# Patient Record
Sex: Female | Born: 1970
Health system: Southern US, Community
[De-identification: ages and names within clinical notes are randomized; demographics above are authoritative.]

## PROBLEM LIST (undated history)

## (undated) DIAGNOSIS — R7303 Prediabetes: Secondary | ICD-10-CM

## (undated) DIAGNOSIS — E538 Deficiency of other specified B group vitamins: Secondary | ICD-10-CM

## (undated) DIAGNOSIS — F411 Generalized anxiety disorder: Secondary | ICD-10-CM

## (undated) DIAGNOSIS — N200 Calculus of kidney: Secondary | ICD-10-CM

## (undated) DIAGNOSIS — E559 Vitamin D deficiency, unspecified: Secondary | ICD-10-CM

## (undated) HISTORY — PX: WISDOM TOOTH EXTRACTION: SHX21

## (undated) HISTORY — PX: ABDOMINAL SURGERY: SHX537

## (undated) HISTORY — PX: TUBAL LIGATION: SHX77

## (undated) HISTORY — DX: Calculus of kidney: N20.0

---

## 2006-09-12 ENCOUNTER — Emergency Department: Payer: Self-pay | Admitting: Emergency Medicine

## 2006-09-12 ENCOUNTER — Other Ambulatory Visit: Payer: Self-pay

## 2006-11-14 ENCOUNTER — Emergency Department: Payer: Self-pay | Admitting: Emergency Medicine

## 2006-11-14 ENCOUNTER — Other Ambulatory Visit: Payer: Self-pay

## 2007-07-19 ENCOUNTER — Inpatient Hospital Stay: Payer: Self-pay | Admitting: Obstetrics and Gynecology

## 2012-08-09 HISTORY — PX: TUBAL LIGATION: SHX77

## 2013-06-18 ENCOUNTER — Ambulatory Visit: Payer: Self-pay

## 2013-07-09 ENCOUNTER — Ambulatory Visit: Payer: Self-pay

## 2013-07-27 ENCOUNTER — Inpatient Hospital Stay: Payer: Self-pay | Admitting: Obstetrics & Gynecology

## 2013-07-27 LAB — CBC WITH DIFFERENTIAL/PLATELET
Basophil %: 0.3 %
Eosinophil #: 0.1 10*3/uL (ref 0.0–0.7)
Eosinophil %: 1 %
Lymphocyte %: 22.4 %
MCHC: 32.7 g/dL (ref 32.0–36.0)
MCV: 84 fL (ref 80–100)
Monocyte #: 0.8 x10 3/mm (ref 0.2–0.9)
Neutrophil #: 7.2 10*3/uL — ABNORMAL HIGH (ref 1.4–6.5)
Neutrophil %: 68.9 %
Platelet: 277 10*3/uL (ref 150–440)
RBC: 3.67 10*6/uL — ABNORMAL LOW (ref 3.80–5.20)
RDW: 14.5 % (ref 11.5–14.5)
WBC: 10.5 10*3/uL (ref 3.6–11.0)

## 2013-07-28 LAB — HEMATOCRIT: HCT: 29.1 % — ABNORMAL LOW (ref 35.0–47.0)

## 2014-06-24 ENCOUNTER — Emergency Department: Payer: Self-pay | Admitting: Emergency Medicine

## 2014-06-24 LAB — CBC
HCT: 39.8 % (ref 35.0–47.0)
HGB: 13 g/dL (ref 12.0–16.0)
MCH: 28.8 pg (ref 26.0–34.0)
MCHC: 32.6 g/dL (ref 32.0–36.0)
MCV: 88 fL (ref 80–100)
PLATELETS: 316 10*3/uL (ref 150–440)
RBC: 4.51 10*6/uL (ref 3.80–5.20)
RDW: 14.8 % — AB (ref 11.5–14.5)
WBC: 8.7 10*3/uL (ref 3.6–11.0)

## 2014-06-24 LAB — URINALYSIS, COMPLETE
Bilirubin,UR: NEGATIVE
GLUCOSE, UR: NEGATIVE mg/dL (ref 0–75)
KETONE: NEGATIVE
Leukocyte Esterase: NEGATIVE
Nitrite: NEGATIVE
Ph: 5 (ref 4.5–8.0)
Protein: 30
RBC,UR: 29 /HPF (ref 0–5)
Specific Gravity: 1.024 (ref 1.003–1.030)
Squamous Epithelial: 12

## 2014-06-24 LAB — COMPREHENSIVE METABOLIC PANEL
AST: 17 U/L (ref 15–37)
Albumin: 3.8 g/dL (ref 3.4–5.0)
Alkaline Phosphatase: 104 U/L
Anion Gap: 7 (ref 7–16)
BUN: 7 mg/dL (ref 7–18)
Bilirubin,Total: 0.5 mg/dL (ref 0.2–1.0)
CREATININE: 0.71 mg/dL (ref 0.60–1.30)
Calcium, Total: 8.4 mg/dL — ABNORMAL LOW (ref 8.5–10.1)
Chloride: 108 mmol/L — ABNORMAL HIGH (ref 98–107)
Co2: 26 mmol/L (ref 21–32)
EGFR (Non-African Amer.): >60
GLUCOSE: 116 mg/dL — AB (ref 65–99)
Osmolality: 280 (ref 275–301)
Potassium: 3.6 mmol/L (ref 3.5–5.1)
SGPT (ALT): 36 U/L
Sodium: 141 mmol/L (ref 136–145)
Total Protein: 6.9 g/dL (ref 6.4–8.2)

## 2014-07-01 ENCOUNTER — Ambulatory Visit: Payer: Self-pay | Admitting: Urology

## 2014-07-10 ENCOUNTER — Ambulatory Visit: Payer: Self-pay | Admitting: Urology

## 2014-07-26 ENCOUNTER — Ambulatory Visit: Payer: Self-pay | Admitting: Urology

## 2014-11-29 NOTE — Op Note (Signed)
PATIENT NAME:  Janet Harmon, Janet Harmon MR#:  [id number] DATE OF BIRTH:  [birth year]  DATE OF PROCEDURE:  07/27/2013  PREOPERATIVE DIAGNOSES:  Multiparous female, previous cesarean section, spontaneous rupture of membranes, unknown group B strep.   POSTOPERATIVE DIAGNOSES:  Multiparous female, previous cesarean section, spontaneous rupture of membranes, unknown group B strep. Female sterilized by bilateral salpingectomy.   PROCEDURE:  Lower uterine transverse cesarean section.  ESTIMATED BLOOD LOSS:   1000 mL.  SURGEON: Erik Obey, M.D.   ASSISTANT:  Marcella Dubs.     ANESTHESIA: Spinal and local.   FINDINGS: Late preterm liveborn female infant at 3:02 a.m. weighing 7 pounds 3 ounces, 3260 grams; Apgars 8 and 9. Normal uterus, tubes and ovaries.    INDICATION: Multiparous female desiring permanent sterilization.   PROCEDURE:  The patient was taken to the operating room and placed in supine position. After adequate spinal anesthesia was instilled, the patient was prepped and draped in the usual sterile fashion. Pfannenstiel skin incision was made approximately 3 fingerbreadths above the pubic symphysis and carried sharply down through her previous Phan incision. Then, the fascia was nicked in the midline, and the incision was extended in a superolateral manner. The rectus fascia was sharply and bluntly dissected off the rectus muscles. The midline in the muscle bellies was identified. The peritoneum was grasped and entered. A bladder blade and placed. A bladder flap was created. Uterine incision was made. The incision was extended with the surgeon's fingers. The infant's head was delivered. Due to angle of the belly, suction was placed on the infant's head, and the infant was delivered with assistance. The remainder of the body was delivered. The cord was clamped and cut. The infant was handed off to the awaiting pediatrician. Cord blood was obtained. Pitocin was started. The uterus was delivered. The  placenta was delivered. The uterus was wrapped in a moist laparotomy sponge, and the interior of the uterus was curetted with a moist lap sponge. Uterine incision was closed with a running locked chromic suture, then a running imbricating chromic suture; 3-0 chromic was used to tack the bladder back up to the uterine incision. The belly was cleared of clots and Kary Kos was used to grab first the right then the left tube. Kelleys were used to grasp the tissue of the peritoneum between the tube and the ovary. This was then cut with a scissors. A plain gut suture was used to tie off the tube. Plain gut suture on a needle was then used to tie off the [place], then clamp off the blood vessels. This was done first on the left, then the right. Good hemostasis was identified. Good tubal telescoping was seen at the stump at the uterus. The uterus was then placed gently back into the pelvis. Good hemostasis was identified, and gutters were cleared of clots. The muscle bellies were approximated with a running Vicryl suture. The ON-Q trocars were placed. The ON-Q catheters were threaded. These were centered around the muscle bellies. The fascial incision was closed with a running Vicryl suture. Plain gut suture was used to approximate the subcutaneous fat. Skin clips were placed. A 4 x 4 was placed over the ON-Q catheters to protect skin. A bandage was placed. Telfa was then placed over the skin incision. The uterus was massaged and found to be firm and free of clot. Clear urine was noted in the Foley bag, and the patient was taken to recovery after having tolerated the procedure well.     ____________________________ Janet Harmon  Clayburn Pert, MD cck:dmm D: 07/30/2013 08:42:00 ET T: 07/30/2013 11:12:09 ET JOB#: [id number]  cc: Delsa Sale, MD, <Dictator> Delsa Sale MD ELECTRONICALLY SIGNED 08/03/2013 22:28

## 2014-12-17 NOTE — H&P (Signed)
L&D Evaluation:  History Expanded:  HPI 44 yo G4P3 who presents at 75 W 5 day with SROM fluid clear and unknown GBS. SHe is RH pos and RUBI/VI/ and got tdap 06/05/13 she is for repeat csection and salpingectomy.   Gravida 4   Term 3   PreTerm 0   Abortion 0   Living 3   Blood Type (Maternal) A positive   Group B Strep Results Maternal (Result >5wks must be treated as unknown) unknown/result > 5 weeks ago   Maternal HIV Negative   Maternal Syphilis Ab Nonreactive   Maternal Varicella Immune   Rubella Results (Maternal) immune   Maternal T-Dap Immune   [facility] 12-Aug-2012   Presents with SROM   Patient's Medical History gestational diabetes and AMA,   Patient's Surgical History Previous C-Section   Medications Pre Natal Vitamins   Allergies NKDA   Social History none   Family History Non-Contributory   ROS:  ROS All systems were reviewed.  HEENT, CNS, GI, GU, Respiratory, CV, Renal and Musculoskeletal systems were found to be normal.   Exam:  Vital Signs stable   General no apparent distress   Mental Status clear   Chest clear   Heart normal sinus rhythm   Abdomen gravid, non-tender   Estimated Fetal Weight Small for gestational age   Fundal Height twerm   Back no CVAT   Edema no edema   Reflexes 1+   Pelvic no external lesions   Mebranes Ruptured   Description clear   FHT normal rate with no decels   Ucx absent   Skin dry   Lymph no lymphadenopathy   Impression:  Impression SROM   Plan:  Plan antibiotics for GBBS prophylaxis   Comments to OR for csection   Follow Up Appointment need to schedule. in 6 weeks   Electronic Signatures: Erik Obey (MD)  (Signed 19-Dec-14 02:39)  Authored: L&D Evaluation   Last Updated: 19-Dec-14 02:39 by Erik Obey (MD)

## 2015-11-06 DIAGNOSIS — E669 Obesity, unspecified: Secondary | ICD-10-CM | POA: Insufficient documentation

## 2016-05-01 ENCOUNTER — Emergency Department
Admission: EM | Admit: 2016-05-01 | Discharge: 2016-05-01 | Disposition: A | Discharge status: Home / Self Care | Payer: 59 | Attending: Emergency Medicine | Admitting: Emergency Medicine

## 2016-05-01 ENCOUNTER — Encounter: Payer: Self-pay | Admitting: Emergency Medicine

## 2016-05-01 ENCOUNTER — Emergency Department: Payer: 59

## 2016-05-01 DIAGNOSIS — M545 Low back pain: Secondary | ICD-10-CM | POA: Diagnosis present

## 2016-05-01 DIAGNOSIS — N200 Calculus of kidney: Secondary | ICD-10-CM | POA: Diagnosis not present

## 2016-05-01 LAB — POCT PREGNANCY, URINE: PREG TEST UR: NEGATIVE

## 2016-05-01 MED ORDER — DEXAMETHASONE SODIUM PHOSPHATE 10 MG/ML IJ SOLN
10.0000 mg | Freq: Once | INTRAMUSCULAR | Status: AC
  Administered 2016-05-01: 10 mg via INTRAMUSCULAR
  Filled 2016-05-01: qty 1

## 2016-05-01 MED ORDER — OXYCODONE-ACETAMINOPHEN 7.5-325 MG PO TABS
1.0000 | ORAL_TABLET | ORAL | 0 refills | Status: DC | PRN
Start: 2016-05-01 — End: 2016-12-22

## 2016-05-01 MED ORDER — METHOCARBAMOL 500 MG PO TABS
1000.0000 mg | ORAL_TABLET | Freq: Once | ORAL | Status: AC
  Administered 2016-05-01: 1000 mg via ORAL
  Filled 2016-05-01: qty 2

## 2016-05-01 MED ORDER — HYDROMORPHONE HCL 1 MG/ML IJ SOLN
1.0000 mg | Freq: Once | INTRAMUSCULAR | Status: AC
  Administered 2016-05-01: 1 mg via INTRAMUSCULAR
  Filled 2016-05-01: qty 1

## 2016-05-01 NOTE — ED Triage Notes (Signed)
Was bending over to pick up toe and suddenonset low back pain radiating to both legs.

## 2016-05-01 NOTE — ED Provider Notes (Signed)
[facility] Emergency Department Provider Note   ____________________________________________   First MD Initiated Contact with Patient 05/01/16 1233     (approximate)  I have reviewed the triage vital signs and the nursing notes.   HISTORY  Chief Complaint Back Pain    HPI Janet Harmon is a 45 y.o. female patient complaining of acute onset of back pain secondary to flexion incident. Patient stated returning to erect position she experience bilateral acute back pain. Patient state radicular patient denies any bladder or bowel dysfunction. component to the bilateral upper legs. Patient rated the pain as a 10 over 10. Patient described a pain as "discomfort/achy". No palliative measures prior to arrival. History reviewed. No pertinent past medical history.  There are no active problems to display for this patient.   Past Surgical History:  Procedure Laterality Date  . CESAREAN SECTION      Prior to Admission medications   Medication Sig Start Date End Date Taking? Authorizing Provider  oxyCODONE-acetaminophen (PERCOCET) 7.5-325 MG tablet Take 1 tablet by mouth every 4 (four) hours as needed for severe pain. 05/01/16   Sable Feil, PA-C    Allergies Review of patient's allergies indicates no known allergies.  No family history on file.  Social History Social History  Substance Use Topics  . Smoking status: Never Smoker  . Smokeless tobacco: Not on file  . Alcohol use Not on file    Review of Systems Constitutional: No fever/chills. Moderate distress Eyes: No visual changes. ENT: No sore throat. Cardiovascular: Denies chest pain. Respiratory: Denies shortness of breath. Gastrointestinal: No abdominal pain.  No nausea, no vomiting.  No diarrhea.  No constipation. Genitourinary: Negative for dysuria. Musculoskeletal: Positive for bilateral  back pain. Skin: Negative for rash. Neurological: Negative for headaches, focal weakness or  numbness.    ____________________________________________   PHYSICAL EXAM:  VITAL SIGNS: ED Triage Vitals  Enc Vitals Group     BP 05/01/16 1047 123/76     Pulse Rate 05/01/16 1047 89     Resp 05/01/16 1047 20     Temp 05/01/16 1047 98.2 F (36.8 C)     Temp Source 05/01/16 1047 Oral     SpO2 05/01/16 1047 97 %     Weight 05/01/16 1049 200 lb (90.7 kg)     Height 05/01/16 1049 5\' 5"  (1.651 m)     Head Circumference --      Peak Flow --      Pain Score 05/01/16 1049 10     Pain Loc --      Pain Edu? --      Excl. in [place]? --     Constitutional: Alert and oriented. Well appearing and in no acute distress. Eyes: Conjunctivae are normal. PERRL. EOMI. Head: Atraumatic. Nose: No congestion/rhinnorhea. Mouth/Throat: Mucous membranes are moist.  Oropharynx non-erythematous. Neck: No stridor.  No cervical spine tenderness to palpation. Hematological/Lymphatic/Immunilogical: No cervical lymphadenopathy. Cardiovascular: Normal rate, regular rhythm. Grossly normal heart sounds.  Good peripheral circulation. Respiratory: Normal respiratory effort.  No retractions. Lungs CTAB. Gastrointestinal: Soft and nontender. No distention. No abdominal bruits. No CVA tenderness. Musculoskeletal: Obvious deformity to the back. Patient decreased range of motion's in all fields. Patient had bilateral flank guarding.  Neurologic:  Normal speech and language. No gross focal neurologic deficits are appreciated. No gait instability. Skin:  Skin is warm, dry and intact. No rash noted. Psychiatric: Mood and affect are normal. Speech and behavior are normal.  ____________________________________________   LABS (all  labs ordered are listed, but only abnormal results are displayed)  Labs Reviewed  POC URINE PREG, ED  POCT PREGNANCY, URINE   ____________________________________________  EKG   ____________________________________________  RADIOLOGY  Findings were consistent with bilateral  nonobstructive renal stones.____________________________________________   PROCEDURES  Procedure(s) performed: None  Procedures  Critical Care performed: No  ____________________________________________   INITIAL IMPRESSION / ASSESSMENT AND PLAN / ED COURSE  Pertinent labs & imaging results that were available during my care of the patient were reviewed by me and considered in my medical decision making (see chart for details).  Bilateral kidney stone. Discussed Percocet x-ray findings with palpation. Patient given discharge care instructions. Patient advised to follow-up with urologist no improvement in 2-3 days. Return to ER if condition worsens. Patient get a prescription for Percocets.  Clinical Course     ____________________________________________   FINAL CLINICAL IMPRESSION(S) / ED DIAGNOSES  Final diagnoses:  Kidney stone      NEW MEDICATIONS STARTED DURING THIS VISIT:  New Prescriptions   OXYCODONE-ACETAMINOPHEN (PERCOCET) 7.5-325 MG TABLET    Take 1 tablet by mouth every 4 (four) hours as needed for severe pain.     Note:  This document was prepared using Dragon voice recognition software and may include unintentional dictation errors.    Sable Feil, PA-C 05/01/16 Haskins Malinda, MD 05/01/16 [phone]

## 2016-05-01 NOTE — ED Notes (Signed)
Pt in xr

## 2016-05-20 ENCOUNTER — Encounter: Payer: Self-pay | Admitting: *Deleted

## 2016-05-20 ENCOUNTER — Encounter
Admission: RE | Disposition: A | Discharge status: Home / Self Care | Payer: Self-pay | Source: Ambulatory Visit | Attending: Urology

## 2016-05-20 ENCOUNTER — Ambulatory Visit
Admission: RE | Admit: 2016-05-20 | Discharge: 2016-05-20 | Disposition: A | Discharge status: Home / Self Care | Payer: 59 | Source: Ambulatory Visit | Attending: Urology | Admitting: Urology

## 2016-05-20 DIAGNOSIS — N200 Calculus of kidney: Secondary | ICD-10-CM | POA: Insufficient documentation

## 2016-05-20 DIAGNOSIS — Z9851 Tubal ligation status: Secondary | ICD-10-CM | POA: Insufficient documentation

## 2016-05-20 DIAGNOSIS — Z6833 Body mass index (BMI) 33.0-33.9, adult: Secondary | ICD-10-CM | POA: Insufficient documentation

## 2016-05-20 DIAGNOSIS — E669 Obesity, unspecified: Secondary | ICD-10-CM | POA: Diagnosis not present

## 2016-05-20 HISTORY — PX: EXTRACORPOREAL SHOCK WAVE LITHOTRIPSY: SHX1557

## 2016-05-20 LAB — POCT PREGNANCY, URINE: PREG TEST UR: NEGATIVE

## 2016-05-20 SURGERY — LITHOTRIPSY, ESWL
Anesthesia: Moderate Sedation | Laterality: Left

## 2016-05-20 MED ORDER — DIPHENHYDRAMINE HCL 25 MG PO CAPS
ORAL_CAPSULE | ORAL | Status: AC
  Filled 2016-05-20: qty 1

## 2016-05-20 MED ORDER — PROMETHAZINE HCL 25 MG/ML IJ SOLN
25.0000 mg | Freq: Once | INTRAMUSCULAR | Status: AC
  Administered 2016-05-20: 25 mg via INTRAMUSCULAR

## 2016-05-20 MED ORDER — MORPHINE SULFATE (PF) 2 MG/ML IV SOLN
INTRAVENOUS | Status: AC
  Administered 2016-05-20: 2 mg via INTRAMUSCULAR
  Filled 2016-05-20: qty 1

## 2016-05-20 MED ORDER — DIPHENHYDRAMINE HCL 25 MG PO CAPS
25.0000 mg | ORAL_CAPSULE | ORAL | Status: AC
  Administered 2016-05-20: 25 mg via ORAL

## 2016-05-20 MED ORDER — NUCYNTA 50 MG PO TABS: 50.0000 mg | ORAL_TABLET | Freq: Four times a day (QID) | ORAL | 0 refills | Status: DC | PRN

## 2016-05-20 MED ORDER — FUROSEMIDE 10 MG/ML IJ SOLN: 10.0000 mg | Freq: Once | INTRAMUSCULAR | Status: DC

## 2016-05-20 MED ORDER — MIDAZOLAM HCL 2 MG/2ML IJ SOLN
INTRAMUSCULAR | Status: AC
  Filled 2016-05-20: qty 2

## 2016-05-20 MED ORDER — ONDANSETRON 8 MG PO TBDP: 8.0000 mg | ORAL_TABLET | Freq: Four times a day (QID) | ORAL | 3 refills | Status: DC | PRN

## 2016-05-20 MED ORDER — MIDAZOLAM HCL 2 MG/2ML IJ SOLN
1.0000 mg | Freq: Once | INTRAMUSCULAR | Status: AC
  Administered 2016-05-20: 1 mg via INTRAMUSCULAR

## 2016-05-20 MED ORDER — MORPHINE SULFATE (PF) 2 MG/ML IV SOLN
10.0000 mg | Freq: Once | INTRAVENOUS | Status: DC
  Filled 2016-05-20: qty 5

## 2016-05-20 MED ORDER — DEXTROSE-NACL 5-0.45 % IV SOLN
INTRAVENOUS | Status: DC
  Administered 2016-05-20: 07:00:00 via INTRAVENOUS

## 2016-05-20 MED ORDER — FUROSEMIDE 10 MG/ML IJ SOLN
INTRAMUSCULAR | Status: DC
Start: 2016-05-20 — End: 2016-05-20
  Filled 2016-05-20: qty 2

## 2016-05-20 MED ORDER — LEVOFLOXACIN 500 MG PO TABS
500.0000 mg | ORAL_TABLET | ORAL | Status: AC
  Administered 2016-05-20: 500 mg via ORAL

## 2016-05-20 MED ORDER — LEVOFLOXACIN 500 MG PO TABS
ORAL_TABLET | ORAL | Status: AC
  Filled 2016-05-20: qty 1

## 2016-05-20 MED ORDER — PROMETHAZINE HCL 25 MG/ML IJ SOLN
INTRAMUSCULAR | Status: AC
  Filled 2016-05-20: qty 1

## 2016-05-20 NOTE — OR Nursing (Signed)
Redness on left flank noted

## 2016-05-20 NOTE — Discharge Instructions (Addendum)
Kidney Stones °Kidney stones (urolithiasis) are deposits that form inside your kidneys. The intense pain is caused by the stone moving through the urinary tract. When the stone moves, the ureter goes into spasm around the stone. The stone is usually passed in the urine.  °CAUSES  °· A disorder that makes certain neck glands produce too much parathyroid hormone (primary hyperparathyroidism). °· A buildup of uric acid crystals, similar to gout in your joints. °· Narrowing (stricture) of the ureter. °· A kidney obstruction present at birth (congenital obstruction). °· Previous surgery on the kidney or ureters. °· Numerous kidney infections. °SYMPTOMS  °· Feeling sick to your stomach (nauseous). °· Throwing up (vomiting). °· Blood in the urine (hematuria). °· Pain that usually spreads (radiates) to the groin. °· Frequency or urgency of urination. °DIAGNOSIS  °· Taking a history and physical exam. °· Blood or urine tests. °· CT scan. °· Occasionally, an examination of the inside of the urinary bladder (cystoscopy) is performed. °TREATMENT  °· Observation. °· Increasing your fluid intake. °· Extracorporeal shock wave lithotripsy--This is a noninvasive procedure that uses shock waves to break up kidney stones. °· Surgery may be needed if you have severe pain or persistent obstruction. There are various surgical procedures. Most of the procedures are performed with the use of small instruments. Only small incisions are needed to accommodate these instruments, so recovery time is minimized. °The size, location, and chemical composition are all important variables that will determine the proper choice of action for you. Talk to your health care provider to better understand your situation so that you will minimize the risk of injury to yourself and your kidney.  °HOME CARE INSTRUCTIONS  °· Drink enough water and fluids to keep your urine clear or pale yellow. This will help you to pass the stone or stone fragments. °· Strain  all urine through the provided strainer. Keep all particulate matter and stones for your health care provider to see. The stone causing the pain may be as small as a grain of salt. It is very important to use the strainer each and every time you pass your urine. The collection of your stone will allow your health care provider to analyze it and verify that a stone has actually passed. The stone analysis will often identify what you can do to reduce the incidence of recurrences. °· Only take over-the-counter or prescription medicines for pain, discomfort, or fever as directed by your health care provider. °· Keep all follow-up visits as told by your health care provider. This is important. °· Get follow-up X-rays if required. The absence of pain does not always mean that the stone has passed. It may have only stopped moving. If the urine remains completely obstructed, it can cause loss of kidney function or even complete destruction of the kidney. It is your responsibility to make sure X-rays and follow-ups are completed. Ultrasounds of the kidney can show blockages and the status of the kidney. Ultrasounds are not associated with any radiation and can be performed easily in a matter of minutes. °· Make changes to your daily diet as told by your health care provider. You may be told to: °¨ Limit the amount of salt that you eat. °¨ Eat 5 or more servings of fruits and vegetables each day. °¨ Limit the amount of meat, poultry, fish, and eggs that you eat. °· Collect a 24-hour urine sample as told by your health care provider. You may need to collect another urine sample every 6-12   months. SEEK MEDICAL CARE IF:  You experience pain that is progressive and unresponsive to any pain medicine you have been prescribed. SEEK IMMEDIATE MEDICAL CARE IF:   Pain cannot be controlled with the prescribed medicine.  You have a fever or shaking chills.  The severity or intensity of pain increases over 18 hours and is not  relieved by pain medicine.  You develop a new onset of abdominal pain.  You feel faint or pass out.  You are unable to urinate.   This information is not intended to replace advice given to you by your health care provider. Make sure you discuss any questions you have with your health care provider.   Document Released: 07/26/2005 Document Revised: 04/16/2015 Document Reviewed: 12/27/2012 Elsevier Interactive Patient Education 2016 [place], Care After Refer to this sheet in the next few weeks. These instructions provide you with information on caring for yourself after your procedure. Your health care provider may also give you more specific instructions. Your treatment has been planned according to current medical practices, but problems sometimes occur. Call your health care provider if you have any problems or questions after your procedure. WHAT TO EXPECT AFTER THE PROCEDURE   Your urine may have a red tinge for a few days after treatment. Blood loss is usually minimal.  You may have soreness in the back or flank area. This usually goes away after a few days. The procedure can cause blotches or bruises on the back where the pressure wave enters the skin. These marks usually cause only minimal discomfort and should disappear in a short time.  Stone fragments should begin to pass within 24 hours of treatment. However, a delayed passage is not unusual.  You may have pain, discomfort, and feel sick to your stomach (nauseated) when the crushed fragments of stone are passed down the tube from the kidney to the bladder. Stone fragments can pass soon after the procedure and may last for up to 4-8 weeks.  A small number of patients may have severe pain when stone fragments are not able to pass, which leads to an obstruction.  If your stone is greater than 1 inch (2.5 cm) in diameter or if you have multiple stones that have a combined diameter greater than 1 inch (2.5 cm),  you may require more than one treatment.  If you had a stent placed prior to your procedure, you may experience some discomfort, especially during urination. You may experience the pain or discomfort in your flank or back, or you may experience a sharp pain or discomfort at the base of your penis or in your lower abdomen. The discomfort usually lasts only a few minutes after urinating. HOME CARE INSTRUCTIONS   Rest at home until you feel your energy improving.  Only take over-the-counter or prescription medicines for pain, discomfort, or fever as directed by your health care provider. Depending on the type of lithotripsy, you may need to take antibiotics and anti-inflammatory medicines for a few days.  Drink enough water and fluids to keep your urine clear or pale yellow. This helps "flush" your kidneys. It helps pass any remaining pieces of stone and prevents stones from coming back.  Most people can resume daily activities within 1-2 days after standard lithotripsy. It can take longer to recover from laser and percutaneous lithotripsy.  Strain all urine through the provided strainer. Keep all particulate matter and stones for your health care provider to see. The stone may be as small  as a grain of salt. It is very important to use the strainer each and every time you pass your urine. Any stones that are found can be sent to a medical lab for examination.  Visit your health care provider for a follow-up appointment in a few weeks. Your doctor may remove your stent if you have one. Your health care provider will also check to see whether stone particles still remain. SEEK MEDICAL CARE IF:   Your pain is not relieved by medicine.  You have a lasting nauseous feeling.  You feel there is too much blood in the urine.  You develop persistent problems with frequent or painful urination that does not at least partially improve after 2 days following the procedure.  You have a congested  cough.  You feel lightheaded.  You develop a rash or any other signs that might suggest an allergic problem.  You develop any reaction or side effects to your medicine(s). SEEK IMMEDIATE MEDICAL CARE IF:   You experience severe back or flank pain or both.  You see nothing but blood when you urinate.  You cannot pass any urine at all.  You have a fever or shaking chills.  You develop shortness of breath, difficulty breathing, or chest pain.  You develop vomiting that will not stop after 6-8 hours.  You have a fainting episode.   This information is not intended to replace advice given to you by your health care provider. Make sure you discuss any questions you have with your health care provider.   Document Released: 08/15/2007 Document Revised: 04/16/2015 Document Reviewed: 02/08/2013 Elsevier Interactive Patient Education 2016 [place].   Renal Colic Renal colic is pain that is caused by passing a kidney stone. The pain can be sharp and severe. It may be felt in the back, abdomen, side (flank), or groin. It can cause nausea. Renal colic can come and go. HOME CARE INSTRUCTIONS Watch your condition for any changes. The following actions may help to lessen any discomfort that you are feeling:  Take medicines only as directed by your health care provider.  Ask your health care provider if it is okay to take over-the-counter pain medicine.  Drink enough fluid to keep your urine clear or pale yellow. Drink 6-8 glasses of water each day.  Limit the amount of salt that you eat to less than 2 grams per day.  Reduce the amount of protein in your diet. Eat less meat, fish, nuts, and dairy.  Avoid foods such as spinach, rhubarb, nuts, or bran. These may make kidney stones more likely to form. SEEK MEDICAL CARE IF:  You have a fever or chills.  Your urine smells bad or looks cloudy.  You have pain or burning when you pass urine. SEEK IMMEDIATE MEDICAL CARE IF:  Your  flank pain or groin pain suddenly worsens.  You become confused or disoriented or you lose consciousness.   This information is not intended to replace advice given to you by your health care provider. Make sure you discuss any questions you have with your health care provider.   Document Released: 05/05/2005 Document Revised: 08/16/2014 Document Reviewed: 06/05/2014 Elsevier Interactive Patient Education [employer].  Lithotripsy Lithotripsy is a treatment that can sometimes help eliminate kidney stones and pain that they cause. A form of lithotripsy, also known as extracorporeal shock wave lithotripsy, is a nonsurgical procedure that helps your body rid itself of the kidney stone when it is too big to pass on its  own. Extracorporeal shock wave lithotripsy is a method of crushing a kidney stone with shock waves. These shock waves pass through your body and are focused on your stone. They cause the kidney stones to crumble while still in the urinary tract. It is then easier for the smaller pieces of stone to pass in the urine. Lithotripsy usually takes about an hour. It is done in a hospital, a lithotripsy center, or a mobile unit. It usually does not require an overnight stay. Your health care provider will instruct you on preparation for the procedure. Your health care provider will tell you what to expect afterward. LET [facility] CARE PROVIDER KNOW ABOUT:  Any allergies you have.  All medicines you are taking, including vitamins, herbs, eye drops, creams, and over-the-counter medicines.  Previous problems you or members of your family have had with the use of anesthetics.  Any blood disorders you have.  Previous surgeries you have had.  Medical conditions you have. RISKS AND COMPLICATIONS Generally, lithotripsy for kidney stones is a safe procedure. However, as with any procedure, complications can occur. Possible complications include:  Infection.  Bleeding of the  kidney.  Bruising of the kidney or skin.  Obstruction of the ureter.  Failure of the stone to fragment. BEFORE THE PROCEDURE  Do not eat or drink for 6-8 hours prior to the procedure. You may, however, take the medications with a sip of water that your physician instructs you to take  Do not take aspirin or aspirin-containing products for 7 days prior to your procedure  Do not take nonsteroidal anti-inflammatory products for 7 days prior to your procedure PROCEDURE A stent (flexible tube with holes) may be placed in your ureter. The ureter is the tube that transports the urine from the kidneys to the bladder. Your health care provider may place a stent before the procedure. This will help keep urine flowing from the kidney if the fragments of the stone block the ureter. You may have an IV tube placed in one of your veins to give you fluids and medicines. These medicines may help you relax or make you sleep. During the procedure, you will lie comfortably on a fluid-filled cushion or in a warm-water bath. After an X-ray or ultrasound exam to locate your stone, shock waves are aimed at the stone. If you are awake, you may feel a tapping sensation as the shock waves pass through your body. If large stone particles remain after treatment, a second procedure may be necessary at a later date. For comfort during the test:  Relax as much as possible.  Try to remain still as much as possible.  Try to follow instructions to speed up the test.  Let your health care provider know if you are uncomfortable, anxious, or in pain. AFTER THE PROCEDURE  After surgery, you will be taken to the recovery area. A nurse will watch and check your progress. Once you're awake, stable, and taking fluids well, you will be allowed to go home as long as there are no problems. You will also be allowed to pass your urine before discharge.You may be given antibiotics to help prevent infection. You may also be prescribed  pain medicine if needed. In a week or two, your health care provider may remove your stent, if you have one. You may first have an X-ray exam to check on how successful the fragmentation of your stone has been and how much of the stone has passed. Your health care provider will  check to see whether or not stone particles remain. SEEK IMMEDIATE MEDICAL CARE IF:  You develop a fever or shaking chills.  Your pain is not relieved by medicine.  You feel sick to your stomach (nauseated) and you vomit.  You develop heavy bleeding.  You have difficulty urinating.  You start to pass your stent from your penis.   This information is not intended to replace advice given to you by your health care provider. Make sure you discuss any questions you have with your health care provider.   Document Released: 07/23/2000 Document Revised: 08/16/2014 Document Reviewed: 02/08/2013 Elsevier Interactive Patient Education [employer].

## 2016-05-21 ENCOUNTER — Encounter: Payer: Self-pay | Admitting: Urology

## 2016-09-01 DIAGNOSIS — R350 Frequency of micturition: Secondary | ICD-10-CM | POA: Diagnosis not present

## 2016-09-01 DIAGNOSIS — J069 Acute upper respiratory infection, unspecified: Secondary | ICD-10-CM | POA: Diagnosis not present

## 2016-12-06 ENCOUNTER — Other Ambulatory Visit: Payer: Self-pay | Admitting: Family Medicine

## 2016-12-06 DIAGNOSIS — Z1231 Encounter for screening mammogram for malignant neoplasm of breast: Secondary | ICD-10-CM

## 2016-12-22 ENCOUNTER — Ambulatory Visit
Admission: RE | Admit: 2016-12-22 | Discharge: 2016-12-22 | Disposition: A | Discharge status: Home / Self Care | Payer: 59 | Source: Ambulatory Visit | Attending: Family Medicine | Admitting: Family Medicine

## 2016-12-22 ENCOUNTER — Encounter: Payer: Self-pay | Admitting: Advanced Practice Midwife

## 2016-12-22 ENCOUNTER — Ambulatory Visit (INDEPENDENT_AMBULATORY_CARE_PROVIDER_SITE_OTHER): Payer: 59 | Admitting: Advanced Practice Midwife

## 2016-12-22 VITALS — BP 124/76 | Ht 64.0 in | Wt 210.0 lb

## 2016-12-22 DIAGNOSIS — Z01419 Encounter for gynecological examination (general) (routine) without abnormal findings: Secondary | ICD-10-CM

## 2016-12-22 DIAGNOSIS — R102 Pelvic and perineal pain: Secondary | ICD-10-CM | POA: Diagnosis not present

## 2016-12-22 DIAGNOSIS — Z124 Encounter for screening for malignant neoplasm of cervix: Secondary | ICD-10-CM | POA: Diagnosis not present

## 2016-12-22 DIAGNOSIS — L918 Other hypertrophic disorders of the skin: Secondary | ICD-10-CM | POA: Diagnosis not present

## 2016-12-22 DIAGNOSIS — Z1231 Encounter for screening mammogram for malignant neoplasm of breast: Secondary | ICD-10-CM

## 2016-12-22 NOTE — Patient Instructions (Signed)
Skin Tag, Adult A skin tag (acrochordon) is a soft, extra growth of skin. Most skin tags are flesh-colored and rarely bigger than a pencil eraser. They commonly form near areas where there are folds in the skin, such as the armpit or groin. Skin tags are not dangerous, and they do not spread from person to person (are not contagious). You may have one skin tag or several. Skin tags do not require treatment. However, your health care provider may recommend removal of a skin tag if it:  Gets irritated from clothing.  Bleeds.  Is visible and unsightly. Your health care provider can remove skin tags with a simple surgical procedure or a procedure that involves freezing the skin tag. Follow these instructions at home:  Watch for any changes in your skin tag. A normal skin tag does not require any other special care at home.  Take over-the-counter and prescription medicines only as told by your health care provider.  Keep all follow-up visits as told by your health care provider. This is important. Contact a health care provider if:  You have a skin tag that:  Becomes painful.  Changes color.  Bleeds.  Swells.  You develop more skin tags. This information is not intended to replace advice given to you by your health care provider. Make sure you discuss any questions you have with your health care provider. Document Released: 08/10/2015 Document Revised: 03/21/2016 Document Reviewed: 08/10/2015 Elsevier Interactive Patient Education  2017 [employer].

## 2016-12-22 NOTE — Progress Notes (Signed)
Patient ID: Janet Harmon, female   DOB: 1970/09/13, 46 y.o.   MRN: [id number]     Gynecology Annual Exam  PCP: Ellene Route  Chief Complaint:  Chief Complaint  Patient presents with  . Annual Exam    History of Present Illness: Patient is a 46 y.o. Janet Harmon presents for annual exam. The patient has complaints today of pain in left pelvis. She is wondering if it is ovarian. She says the pain is daily and she has noticed it for several months. She also has two skin tags in the groin area that she states have been there for 5 years. Patient states that they have not changed in that time. She denies pain or tenderness generally but that they have bled when she shaved and came too close to the skin tag. They also are irritated by underwear. She is wondering if they can be removed today.    LMP: Patient's last menstrual period was 12/17/2016. Average Interval: regular, 28 days Duration of flow: 5 days Heavy Menses: no Clots: no Intermenstrual Bleeding: no Postcoital Bleeding: no Dysmenorrhea: no   The patient is sexually active. She currently uses bilateral tubal ligation for contraception. She denies dyspareunia.  The patient does not perform self breast exams.  There is no notable family history of breast or ovarian cancer in her family.  The patient wears seatbelts: yes.   The patient has regular exercise: she walks and is active with her children. She denies cardio exercise. She admits to improving her diet.  The patient denies current symptoms of depression.    Review of Systems: Review of Systems  Constitutional: Negative.   HENT: Negative.   Eyes: Negative.   Respiratory: Negative.   Cardiovascular: Negative.   Gastrointestinal: Negative.   Genitourinary: Negative.   Musculoskeletal: Negative.   Skin: Negative.   Neurological: Negative.   Endo/Heme/Allergies: Negative.   Psychiatric/Behavioral: Negative.     Past Medical History:  Past Medical History:    Diagnosis Date  . Kidney stone     Past Surgical History:  Past Surgical History:  Procedure Laterality Date  . CESAREAN SECTION    . EXTRACORPOREAL SHOCK WAVE LITHOTRIPSY Left 05/20/2016   Procedure: EXTRACORPOREAL SHOCK WAVE LITHOTRIPSY (ESWL);  Surgeon: Royston Cowper, MD;  Location: [facility] ORS;  Service: Urology;  Laterality: Left;  . TUBAL LIGATION    . WISDOM TOOTH EXTRACTION      Gynecologic History:  Patient's last menstrual period was 12/17/2016. Contraception: tubal ligation Last Pap: Results were: no abnormalities  Last mammogram: normal Obstetric History: [id number]  Family History:  History reviewed. No pertinent family history.  Social History:  Social History   Social History  . Marital status: Married    Spouse name: N/A  . Number of children: N/A  . Years of education: N/A   Occupational History  . Not on file.   Social History Main Topics  . Smoking status: Never Smoker  . Smokeless tobacco: Never Used  . Alcohol use No  . Drug use: No  . Sexual activity: Yes    Birth control/ protection: Surgical   Other Topics Concern  . Not on file   Social History Narrative  . No narrative on file    Allergies:  No Known Allergies  Medications: Prior to Admission medications   Medication Sig Start Date End Date Taking? Authorizing Provider  ondansetron (ZOFRAN ODT) 8 MG disintegrating tablet Take 1 tablet (8 mg total) by mouth every 6 (six) hours as needed for  nausea or vomiting. Patient not taking: Reported on 12/22/2016 05/20/16   Royston Cowper, MD    Physical Exam Vitals: Blood pressure 124/76, height 5\' 4"  (1.626 m), weight 210 lb (95.3 kg), last menstrual period 12/17/2016.  General: NAD HEENT: normocephalic, anicteric Thyroid: no enlargement, no palpable nodules Pulmonary: No increased work of breathing, CTAB Cardiovascular: RRR, distal pulses 2+ Breast: Breast symmetrical, no tenderness, no palpable nodules or masses, no skin or nipple  retraction present, no nipple discharge.  No axillary or supraclavicular lymphadenopathy. Abdomen: NABS, soft, non-tender, non-distended.  Umbilicus without lesions.  No hepatomegaly, splenomegaly or masses palpable. No evidence of hernia  Genitourinary:  External: Normal external female genitalia.  Normal urethral meatus, normal Bartholin's and Skene's glands.  Skin tags- one on right groin and one on left groin. Each one is .5cm x .5cm. No evidence of pathology.  Vagina: Normal vaginal mucosa, no evidence of prolapse.    Cervix: Grossly normal in appearance, no bleeding, no CMT  Uterus: Non-enlarged, mobile, normal contour.    Adnexa: ovaries non-enlarged, no adnexal masses, no tenderness palpated  Rectal: deferred  Lymphatic: no evidence of inguinal lymphadenopathy Extremities: no edema, erythema, or tenderness Neurologic: Grossly intact Psychiatric: mood appropriate, affect full  Procedure: 51ml 1% lidocaine injected just under each skin tag. Area cleaned with betadine. Excision using grasping forceps and surgical scissors. Both areas with minimal bleeding. Pressure with gauze and then Silver Nitrate applied. Procedure done with sterile technique.   Assessment: 46 y.o. Janet Harmon Well woman exam with PAP Plan: Problem List Items Addressed This Visit    None    Visit Diagnoses    Well woman exam with routine gynecological exam    -  Primary   Pelvic pain in female       Relevant Orders   US Transvaginal Non-OB   Multiple acquired skin tags       Cervical cancer screening          1) Mammogram - recommend yearly screening mammogram.  Mammogram patient to self schedule at [facility]   2) STI screening was offered and declined  3) ASCCP guidelines and rational discussed.  Patient opts for yearly screening interval  4) Continue healthy lifestyle diet and increase healthy lifestyle exercise  5) Gyn u/s in next week for left side pelvic pain  6) Routine healthcare  maintenance including cholesterol, diabetes screening discussed managed by PCP    Rod Can, CNM

## 2016-12-25 LAB — PAP IG (IMAGE GUIDED): PAP SMEAR COMMENT: 0

## 2017-01-05 ENCOUNTER — Ambulatory Visit: Payer: 59 | Admitting: Advanced Practice Midwife

## 2017-01-05 ENCOUNTER — Other Ambulatory Visit: Payer: 59

## 2017-01-12 ENCOUNTER — Ambulatory Visit: Payer: 59 | Admitting: Advanced Practice Midwife

## 2017-01-12 ENCOUNTER — Other Ambulatory Visit: Payer: 59

## 2017-01-24 ENCOUNTER — Emergency Department
Admission: EM | Admit: 2017-01-24 | Discharge: 2017-01-24 | Disposition: A | Discharge status: Home / Self Care | Payer: 59 | Attending: Emergency Medicine | Admitting: Emergency Medicine

## 2017-01-24 ENCOUNTER — Emergency Department: Payer: 59

## 2017-01-24 ENCOUNTER — Encounter: Payer: Self-pay | Admitting: Emergency Medicine

## 2017-01-24 DIAGNOSIS — N23 Unspecified renal colic: Secondary | ICD-10-CM | POA: Diagnosis not present

## 2017-01-24 DIAGNOSIS — R319 Hematuria, unspecified: Secondary | ICD-10-CM | POA: Diagnosis not present

## 2017-01-24 DIAGNOSIS — R109 Unspecified abdominal pain: Secondary | ICD-10-CM | POA: Diagnosis not present

## 2017-01-24 DIAGNOSIS — R1031 Right lower quadrant pain: Secondary | ICD-10-CM | POA: Diagnosis present

## 2017-01-24 DIAGNOSIS — N132 Hydronephrosis with renal and ureteral calculous obstruction: Secondary | ICD-10-CM | POA: Diagnosis not present

## 2017-01-24 LAB — URINALYSIS, COMPLETE (UACMP) WITH MICROSCOPIC
BILIRUBIN URINE: NEGATIVE
GLUCOSE, UA: NEGATIVE mg/dL
KETONES UR: NEGATIVE mg/dL
LEUKOCYTES UA: NEGATIVE
NITRITE: NEGATIVE
Protein, ur: NEGATIVE mg/dL
SPECIFIC GRAVITY, URINE: 1.021 (ref 1.005–1.030)
pH: 5 (ref 5.0–8.0)

## 2017-01-24 LAB — PREGNANCY, URINE: Preg Test, Ur: NEGATIVE

## 2017-01-24 MED ORDER — TAMSULOSIN HCL 0.4 MG PO CAPS
ORAL_CAPSULE | ORAL | Status: AC
  Administered 2017-01-24: 0.4 mg via ORAL
  Filled 2017-01-24: qty 1

## 2017-01-24 MED ORDER — KETOROLAC TROMETHAMINE 60 MG/2ML IM SOLN
15.0000 mg | Freq: Once | INTRAMUSCULAR | Status: AC
  Administered 2017-01-24: 15 mg via INTRAMUSCULAR
  Filled 2017-01-24: qty 2

## 2017-01-24 MED ORDER — OXYCODONE-ACETAMINOPHEN 5-325 MG PO TABS
2.0000 | ORAL_TABLET | Freq: Once | ORAL | Status: AC
  Administered 2017-01-24: 2 via ORAL
  Filled 2017-01-24: qty 2

## 2017-01-24 MED ORDER — TAMSULOSIN HCL 0.4 MG PO CAPS
0.4000 mg | ORAL_CAPSULE | Freq: Once | ORAL | Status: AC
  Administered 2017-01-24: 0.4 mg via ORAL

## 2017-01-24 MED ORDER — OXYCODONE-ACETAMINOPHEN 5-325 MG PO TABS: 1.0000 | ORAL_TABLET | Freq: Four times a day (QID) | ORAL | 0 refills | Status: DC | PRN

## 2017-01-24 MED ORDER — OXYCODONE-ACETAMINOPHEN 5-325 MG PO TABS
1.0000 | ORAL_TABLET | ORAL | Status: AC | PRN
  Administered 2017-01-24 (×2): 1 via ORAL
  Filled 2017-01-24 (×2): qty 1

## 2017-01-24 NOTE — ED Triage Notes (Signed)
Pt with right side flank pain started this am, pt with hx of kidney stones.

## 2017-01-24 NOTE — ED Notes (Signed)
Pt states right flank pain that began this AM, denies any blood in her urine, states feeling bloated and distended, awake and alert, tearful, resp even and unlabored

## 2017-01-24 NOTE — Discharge Instructions (Signed)
It was a pleasure to take care of you today, and thank you for coming to our emergency department.  If you have any questions or concerns before leaving please ask the nurse to grab me and I'm more than happy to go through your aftercare instructions again.  If you have any concerns once you are home that you are not improving or are in fact getting worse before you can make it to your follow-up appointment, please do not hesitate to call 911 and come back for further evaluation.  You should return to the emergency room immediately if you have new or severe symptoms such as chest pain, difficulty breathing, passing out, high fever, severe pain, or unremitting vomiting.   Available test results from today are listed below.   Carrie Mew, MD  Results for orders placed or performed during the hospital encounter of 01/24/17  Urinalysis, Complete w Microscopic  Result Value Ref Range   Color, Urine YELLOW (A) YELLOW   APPearance HAZY (A) CLEAR   Specific Gravity, Urine 1.021 1.005 - 1.030   pH 5.0 5.0 - 8.0   Glucose, UA NEGATIVE NEGATIVE mg/dL   Hgb urine dipstick LARGE (A) NEGATIVE   Bilirubin Urine NEGATIVE NEGATIVE   Ketones, ur NEGATIVE NEGATIVE mg/dL   Protein, ur NEGATIVE NEGATIVE mg/dL   Nitrite NEGATIVE NEGATIVE   Leukocytes, UA NEGATIVE NEGATIVE   RBC / HPF TOO NUMEROUS TO COUNT 0 - 5 RBC/hpf   WBC, UA 0-5 0 - 5 WBC/hpf   Bacteria, UA RARE (A) NONE SEEN   Squamous Epithelial / LPF 6-30 (A) NONE SEEN   Mucous PRESENT    Sperm, UA PRESENT   Pregnancy, urine  Result Value Ref Range   Preg Test, Ur NEGATIVE NEGATIVE   Ct Renal Stone Study  Result Date: 01/24/2017 CLINICAL DATA:  46 y/o F; right flank pain radiating to the right lower quadrant. EXAM: CT ABDOMEN AND PELVIS WITHOUT CONTRAST TECHNIQUE: Multidetector CT imaging of the abdomen and pelvis was performed following the standard protocol without IV contrast. COMPARISON:  None. FINDINGS: Lower chest: No acute abnormality.  Hepatobiliary: No focal liver abnormality is seen. No gallstones, gallbladder wall thickening, or biliary dilatation. Pancreas: Unremarkable. No pancreatic ductal dilatation or surrounding inflammatory changes. Spleen: Normal in size without focal abnormality. Adrenals/Urinary Tract: Normal adrenal glands. Multiple punctate nonobstructing stones in the kidneys bilaterally. Mild right hydronephrosis and moderate right-sided perinephric stranding and fluid secondary to a 3 mm obstructing stone likely within the right ureterovesicular junction projecting in the right bladder trigone (series 2, image 79). No left hydronephrosis.  Otherwise normal bladder. Stomach/Bowel: Stomach is within normal limits. Appendix appears normal. No evidence of bowel wall thickening, distention, or inflammatory changes. Vascular/Lymphatic: No significant vascular findings are present. No enlarged abdominal or pelvic lymph nodes. Reproductive: Uterus and bilateral adnexa are unremarkable. Other: No abdominal wall hernia or abnormality. No abdominopelvic ascites. Musculoskeletal: No acute or significant osseous findings. Articulation of right L5 transverse process with sacral ala. IMPRESSION: Mild right hydronephrosis and moderate right perinephric stranding secondary to a 3 mm stone likely obstructing the right intramural ureterovesicular junction. Electronically Signed   By: Kristine Garbe M.D.   On: 01/24/2017 20:39

## 2017-01-24 NOTE — ED Provider Notes (Signed)
[facility] Emergency Department Provider Note  ____________________________________________  Time seen: Approximately 9:22 PM  I have reviewed the triage vital signs and the nursing notes.   HISTORY  Chief Complaint Flank Pain    HPI Janet Harmon is a 46 y.o. female who complains of right flank pain radiating around to the right lower quadrant since this morning. No dysuria frequency urgency. No hematuria. No fevers or chills. No syncope. Has nausea but no vomiting. No diarrhea or constipation. Has a history of kidney stone but this feels worse. Pain is severe, intermittent waxing and waning. No aggravating or alleviating factors.     Past Medical History:  Diagnosis Date  . Kidney stone      Patient Active Problem List   Diagnosis Date Noted  . Obesity, Class II, BMI 35-39.9 11/06/2015     Past Surgical History:  Procedure Laterality Date  . CESAREAN SECTION    . EXTRACORPOREAL SHOCK WAVE LITHOTRIPSY Left 05/20/2016   Procedure: EXTRACORPOREAL SHOCK WAVE LITHOTRIPSY (ESWL);  Surgeon: Royston Cowper, MD;  Location: [facility] ORS;  Service: Urology;  Laterality: Left;  . TUBAL LIGATION    . WISDOM TOOTH EXTRACTION       Prior to Admission medications   Not on File     Allergies Patient has no known allergies.   History reviewed. No pertinent family history.  Social History Social History  Substance Use Topics  . Smoking status: Never Smoker  . Smokeless tobacco: Never Used  . Alcohol use No    Review of Systems  Constitutional:   No fever or chills.  ENT:   No sore throat. No rhinorrhea. Cardiovascular:   No chest pain or syncope. Respiratory:   No dyspnea or cough. Gastrointestinal:   Positive as above for right flank abdominal pain. No vomiting or diarrhea..  Musculoskeletal:   Negative for focal pain or swelling All other systems reviewed and are negative except as documented above in ROS and  HPI.  ____________________________________________   PHYSICAL EXAM:  VITAL SIGNS: ED Triage Vitals  Enc Vitals Group     BP 01/24/17 1612 136/82     Pulse Rate 01/24/17 1612 84     Resp 01/24/17 1612 14     Temp 01/24/17 1612 98.5 F (36.9 C)     Temp Source 01/24/17 1612 Oral     SpO2 01/24/17 1612 98 %     Weight 01/24/17 1612 200 lb (90.7 kg)     Height 01/24/17 1612 5\' 4"  (1.626 m)     Head Circumference --      Peak Flow --      Pain Score 01/24/17 1622 10     Pain Loc --      Pain Edu? --      Excl. in [place]? --     Vital signs reviewed, nursing assessments reviewed.   Constitutional:   Alert and oriented. Well appearing and in no distress. Eyes:   No scleral icterus.  EOMI. No nystagmus. No conjunctival pallor. PERRL. ENT   Head:   Normocephalic and atraumatic.   Nose:   No congestion/rhinnorhea.    Mouth/Throat:   MMM, no pharyngeal erythema. No peritonsillar mass.    Neck:   No meningismus. Full ROM Hematological/Lymphatic/Immunilogical:   No cervical lymphadenopathy. Cardiovascular:   RRR. Symmetric bilateral radial and DP pulses.  No murmurs.  Respiratory:   Normal respiratory effort without tachypnea/retractions. Breath sounds are clear and equal bilaterally. No wheezes/rales/rhonchi. Gastrointestinal:   Soft With  mild right lower quadrant tenderness. No tenderness at McBurney's point.. Non distended. There is no CVA tenderness.  No rebound, rigidity, or guarding. Genitourinary:   deferred Musculoskeletal:   Normal range of motion in all extremities. No joint effusions.  No lower extremity tenderness.  No edema. Neurologic:   Normal speech and language.  Motor grossly intact. No gross focal neurologic deficits are appreciated.  Skin:    Skin is warm, dry and intact. No rash noted.  No petechiae, purpura, or bullae.  ____________________________________________    LABS (pertinent positives/negatives) (all labs ordered are listed, but only  abnormal results are displayed) Labs Reviewed  URINALYSIS, COMPLETE (UACMP) WITH MICROSCOPIC - Abnormal; Notable for the following:       Result Value   Color, Urine YELLOW (*)    APPearance HAZY (*)    Hgb urine dipstick LARGE (*)    Bacteria, UA RARE (*)    Squamous Epithelial / LPF 6-30 (*)    All other components within normal limits  PREGNANCY, URINE   ____________________________________________   EKG    ____________________________________________    RADIOLOGY  Ct Renal Stone Study  Result Date: 01/24/2017 CLINICAL DATA:  46 y/o F; right flank pain radiating to the right lower quadrant. EXAM: CT ABDOMEN AND PELVIS WITHOUT CONTRAST TECHNIQUE: Multidetector CT imaging of the abdomen and pelvis was performed following the standard protocol without IV contrast. COMPARISON:  None. FINDINGS: Lower chest: No acute abnormality. Hepatobiliary: No focal liver abnormality is seen. No gallstones, gallbladder wall thickening, or biliary dilatation. Pancreas: Unremarkable. No pancreatic ductal dilatation or surrounding inflammatory changes. Spleen: Normal in size without focal abnormality. Adrenals/Urinary Tract: Normal adrenal glands. Multiple punctate nonobstructing stones in the kidneys bilaterally. Mild right hydronephrosis and moderate right-sided perinephric stranding and fluid secondary to a 3 mm obstructing stone likely within the right ureterovesicular junction projecting in the right bladder trigone (series 2, image 79). No left hydronephrosis.  Otherwise normal bladder. Stomach/Bowel: Stomach is within normal limits. Appendix appears normal. No evidence of bowel wall thickening, distention, or inflammatory changes. Vascular/Lymphatic: No significant vascular findings are present. No enlarged abdominal or pelvic lymph nodes. Reproductive: Uterus and bilateral adnexa are unremarkable. Other: No abdominal wall hernia or abnormality. No abdominopelvic ascites. Musculoskeletal: No acute or  significant osseous findings. Articulation of right L5 transverse process with sacral ala. IMPRESSION: Mild right hydronephrosis and moderate right perinephric stranding secondary to a 3 mm stone likely obstructing the right intramural ureterovesicular junction. Electronically Signed   By: Kristine Garbe M.D.   On: 01/24/2017 20:39    ____________________________________________   PROCEDURES Procedures  ____________________________________________   INITIAL IMPRESSION / ASSESSMENT AND PLAN / ED COURSE  Pertinent labs & imaging results that were available during my care of the patient were reviewed by me and considered in my medical decision making (see chart for details).  Patient well appearing no acute distress, unremarkable vital signs per likely kidney stone but with right-sided pain and some right lower quadrant tenderness, CT obtained. CT does confirm a 3 mm right UVJ stone. We'll give a dose of Flomax as well as Percocet here in the ED. Patient received IM Toradol as well. Will take ibuprofen at home as well. She a heart has an appointment with her urologist for tomorrow. We'll give her a urine strainer in case she passes the stone in the meantime.Considering the patient's symptoms, medical history, and physical examination today, I have low suspicion for cholecystitis or biliary pathology, pancreatitis, perforation or bowel obstruction, hernia, intra-abdominal  abscess, AAA or dissection, volvulus or intussusception, mesenteric ischemia, or appendicitis. Results not consistent with UTI or pyelonephritis       ____________________________________________   FINAL CLINICAL IMPRESSION(S) / ED DIAGNOSES  Final diagnoses:  Hematuria, unspecified type  Ureteral colic      New Prescriptions   No medications on file     Portions of this note were generated with dragon dictation software. Dictation errors may occur despite best attempts at proofreading.    Carrie Mew, MD 01/24/17 2124

## 2017-05-30 ENCOUNTER — Encounter: Payer: Self-pay | Admitting: Emergency Medicine

## 2017-05-30 ENCOUNTER — Emergency Department
Admission: EM | Admit: 2017-05-30 | Discharge: 2017-05-30 | Disposition: A | Discharge status: Home / Self Care | Payer: 59 | Attending: Emergency Medicine | Admitting: Emergency Medicine

## 2017-05-30 ENCOUNTER — Emergency Department: Payer: 59

## 2017-05-30 DIAGNOSIS — R079 Chest pain, unspecified: Secondary | ICD-10-CM | POA: Diagnosis not present

## 2017-05-30 DIAGNOSIS — R0789 Other chest pain: Secondary | ICD-10-CM | POA: Diagnosis not present

## 2017-05-30 LAB — BASIC METABOLIC PANEL
Anion gap: 9 (ref 5–15)
BUN: 8 mg/dL (ref 6–20)
CHLORIDE: 106 mmol/L (ref 101–111)
CO2: 26 mmol/L (ref 22–32)
CREATININE: 0.73 mg/dL (ref 0.44–1.00)
Calcium: 9.2 mg/dL (ref 8.9–10.3)
GFR calc non Af Amer: >60 mL/min (ref >60)
Glucose, Bld: 109 mg/dL — ABNORMAL HIGH (ref 65–99)
POTASSIUM: 3.6 mmol/L (ref 3.5–5.1)
SODIUM: 141 mmol/L (ref 135–145)

## 2017-05-30 LAB — CBC
HEMATOCRIT: 40 % (ref 35.0–47.0)
HEMOGLOBIN: 13.4 g/dL (ref 12.0–16.0)
MCH: 29.2 pg (ref 26.0–34.0)
MCHC: 33.4 g/dL (ref 32.0–36.0)
MCV: 87.4 fL (ref 80.0–100.0)
Platelets: 295 10*3/uL (ref 150–440)
RBC: 4.57 MIL/uL (ref 3.80–5.20)
RDW: 13.9 % (ref 11.5–14.5)
WBC: 8.8 10*3/uL (ref 3.6–11.0)

## 2017-05-30 LAB — TROPONIN I: Troponin I: <0.03 ng/mL (ref <0.03)

## 2017-05-30 MED ORDER — LORAZEPAM 0.5 MG PO TABS: 0.5000 mg | ORAL_TABLET | Freq: Every evening | ORAL | 0 refills | Status: AC | PRN

## 2017-05-30 NOTE — ED Provider Notes (Signed)
[facility] Emergency Department Provider Note  ____________________________________________   First MD Initiated Contact with Patient 05/30/17 1950     (approximate)  I have reviewed the triage vital signs and the nursing notes.   HISTORY  Chief Complaint Chest Pain    HPI Janet Harmon is a 46 y.o. female who presents to the emergency department with 3 days of atypical chest pain. The pain is pressure-like upper chest pain radiating towards her left arm. It is nonexertional. Not associated with nausea. Not associated with diaphoresis. The pain itself lasted seconds at a time. When she feels the pain she becomes quite anxious and hyperventilates. She feels some periorbital tingling as well as twitching in her right eye. She does not use birth control. She's had no recent travel or immobilization. She has no leg swelling. The pain is not ripping or tearing and does not go straight to her back.   Past Medical History:  Diagnosis Date  . Kidney stone     Patient Active Problem List   Diagnosis Date Noted  . Obesity, Class II, BMI 35-39.9 11/06/2015    Past Surgical History:  Procedure Laterality Date  . CESAREAN SECTION    . EXTRACORPOREAL SHOCK WAVE LITHOTRIPSY Left 05/20/2016   Procedure: EXTRACORPOREAL SHOCK WAVE LITHOTRIPSY (ESWL);  Surgeon: Royston Cowper, MD;  Location: [facility] ORS;  Service: Urology;  Laterality: Left;  . TUBAL LIGATION    . WISDOM TOOTH EXTRACTION      Prior to Admission medications   Medication Sig Start Date End Date Taking? Authorizing Provider  LORazepam (ATIVAN) 0.5 MG tablet Take 1 tablet (0.5 mg total) by mouth at bedtime as needed for anxiety or sleep. 05/30/17 06/04/17  Darel Hong, MD  oxyCODONE-acetaminophen (ROXICET) 5-325 MG tablet Take 1 tablet by mouth every 6 (six) hours as needed for severe pain. 01/24/17   Carrie Mew, MD    Allergies Patient has no known allergies.  No family history on  file.  Social History Social History  Substance Use Topics  . Smoking status: Never Smoker  . Smokeless tobacco: Never Used  . Alcohol use No    Review of Systems Constitutional: No fever/chills Eyes: No visual changes. ENT: No sore throat. Cardiovascular: positive for chest pain. Respiratory: Denies shortness of breath. Gastrointestinal: No abdominal pain.  No nausea, no vomiting.  No diarrhea.  No constipation. Genitourinary: Negative for dysuria. Musculoskeletal: Negative for back pain. Skin: Negative for rash. Neurological: Negative for headaches   ____________________________________________   PHYSICAL EXAM:  VITAL SIGNS: ED Triage Vitals  Enc Vitals Group     BP 05/30/17 1919 136/81     Pulse Rate 05/30/17 1919 88     Resp 05/30/17 1919 20     Temp 05/30/17 1919 97.9 F (36.6 C)     Temp Source 05/30/17 1919 Oral     SpO2 05/30/17 1919 98 %     Weight 05/30/17 1920 200 lb (90.7 kg)     Height 05/30/17 1920 5\' 4"  (1.626 m)     Head Circumference --      Peak Flow --      Pain Score --      Pain Loc --      Pain Edu? --      Excl. in [place]? --     Constitutional: alert and oriented 4 well appearing nontoxic no diaphoresis speaks full clear sentences Eyes: PERRL EOMI. Head: Atraumatic. Nose: No congestion/rhinnorhea. Mouth/Throat: No trismus Neck: No stridor.  Cardiovascular: Normal rate, regular rhythm. Grossly normal heart sounds.  Good peripheral circulation. Respiratory: Normal respiratory effort.  No retractions. Lungs CTAB and moving good air Gastrointestinal: . Soft Nontender Musculoskeletal: No lower extremity edema  legs are equal in size Neurologic:  Normal speech and language. No gross focal neurologic deficits are appreciated. Skin:  Skin is warm, dry and intact. No rash noted. Psychiatric: Mood and affect are normal. Speech and behavior are normal.    ____________________________________________   DIFFERENTIAL includes but not limited  to  G coronary syndrome, pulmonary embolus, pneumothorax, pneumonia, aortic dissection ____________________________________________   LABS (all labs ordered are listed, but only abnormal results are displayed)  Labs Reviewed  BASIC METABOLIC PANEL - Abnormal; Notable for the following:       Result Value   Glucose, Bld 109 (*)    All other components within normal limits  CBC  TROPONIN I    or were reviewed and interpreted by me shows no acute ischemia __________________________________________  EKG  ED ECG REPORT I, Darel Hong, the attending physician, personally viewed and interpreted this ECG.  Date: 05/30/2017 EKG Time:  Rate: 86 Rhythm: normal sinus rhythm QRS Axis: normal Intervals: normal ST/T Wave abnormalities: normal Narrative Interpretation: no evidence of acute ischemia  ____________________________________________  RADIOLOGY  chest x-ray reviewed by me shows no acute disease ____________________________________________   PROCEDURES  Procedure(s) performed: no  Procedures  Critical Care performed: no  Observation: no ____________________________________________   INITIAL IMPRESSION / ASSESSMENT AND PLAN / ED COURSE  Pertinent labs & imaging results that were available during my care of the patient were reviewed by me and considered in my medical decision making (see chart for details).       The patient has a HEART score of 1 with quite atypical chest pain.  She is PERC negative.  she feels significant relief when discussing her results. Regarding her. Oral tingling in her arm tingling I think this is likely secondary to hyperventilation. The patient has primary care follow-up in 2 days and strict return precautions have been discussed. She verbalizes understanding and agreement with the plan.____________________________________________   FINAL CLINICAL IMPRESSION(S) / ED DIAGNOSES  Final diagnoses:  Atypical chest pain       NEW MEDICATIONS STARTED DURING THIS VISIT:  New Prescriptions   LORAZEPAM (ATIVAN) 0.5 MG TABLET    Take 1 tablet (0.5 mg total) by mouth at bedtime as needed for anxiety or sleep.     Note:  This document was prepared using Dragon voice recognition software and may include unintentional dictation errors.     Darel Hong, MD 05/30/17 2057

## 2017-05-30 NOTE — ED Triage Notes (Signed)
Pt reports chest pain for 3 days radiating to left arm, reports left eye twitching, pt denies any other symptom denies any injury pt talks in complete sentences no respiratory distress noted

## 2017-05-30 NOTE — Discharge Instructions (Signed)
Please keep your appointment to see your primary care physician in 2 days as scheduled and return to the emergency department for any concerns.  It was a pleasure to take care of you today, and thank you for coming to our emergency department.  If you have any questions or concerns before leaving please ask the nurse to grab me and I'm more than happy to go through your aftercare instructions again.  If you were prescribed any opioid pain medication today such as Norco, Vicodin, Percocet, morphine, hydrocodone, or oxycodone please make sure you do not drive when you are taking this medication as it can alter your ability to drive safely.  If you have any concerns once you are home that you are not improving or are in fact getting worse before you can make it to your follow-up appointment, please do not hesitate to call 911 and come back for further evaluation.  Darel Hong, MD  Results for orders placed or performed during the hospital encounter of 38/25/05  Basic metabolic panel  Result Value Ref Range   Sodium 141 135 - 145 mmol/L   Potassium 3.6 3.5 - 5.1 mmol/L   Chloride 106 101 - 111 mmol/L   CO2 26 22 - 32 mmol/L   Glucose, Bld 109 (H) 65 - 99 mg/dL   BUN 8 6 - 20 mg/dL   Creatinine, Ser 0.73 0.44 - 1.00 mg/dL   Calcium 9.2 8.9 - 10.3 mg/dL   GFR calc non Af Amer >60 >60 mL/min   GFR calc Af Amer >60 >60 mL/min   Anion gap 9 5 - 15  CBC  Result Value Ref Range   WBC 8.8 3.6 - 11.0 K/uL   RBC 4.57 3.80 - 5.20 MIL/uL   Hemoglobin 13.4 12.0 - 16.0 g/dL   HCT 40.0 35.0 - 47.0 %   MCV 87.4 80.0 - 100.0 fL   MCH 29.2 26.0 - 34.0 pg   MCHC 33.4 32.0 - 36.0 g/dL   RDW 13.9 11.5 - 14.5 %   Platelets 295 150 - 440 K/uL  Troponin I  Result Value Ref Range   Troponin I <0.03 <0.03 ng/mL   Dg Chest 2 View  Result Date: 05/30/2017 CLINICAL DATA:  Chest pain radiates to left arm. EXAM: CHEST  2 VIEW COMPARISON:  11/14/2006 FINDINGS: The lungs are clear without focal pneumonia,  edema, pneumothorax or pleural effusion. The cardiopericardial silhouette is within normal limits for size. The visualized bony structures of the thorax are intact. IMPRESSION: No active cardiopulmonary disease. Electronically Signed   By: Misty Stanley M.D.   On: 05/30/2017 19:44

## 2017-06-01 DIAGNOSIS — Z0000 Encounter for general adult medical examination without abnormal findings: Secondary | ICD-10-CM | POA: Diagnosis not present

## 2017-06-01 DIAGNOSIS — E782 Mixed hyperlipidemia: Secondary | ICD-10-CM | POA: Diagnosis not present

## 2017-06-02 DIAGNOSIS — Z131 Encounter for screening for diabetes mellitus: Secondary | ICD-10-CM | POA: Diagnosis not present

## 2017-06-02 DIAGNOSIS — E559 Vitamin D deficiency, unspecified: Secondary | ICD-10-CM | POA: Diagnosis not present

## 2017-06-02 DIAGNOSIS — E782 Mixed hyperlipidemia: Secondary | ICD-10-CM | POA: Diagnosis not present

## 2017-06-02 DIAGNOSIS — E538 Deficiency of other specified B group vitamins: Secondary | ICD-10-CM | POA: Diagnosis not present

## 2017-06-17 ENCOUNTER — Ambulatory Visit: Payer: 59 | Admitting: Dietician

## 2017-06-21 ENCOUNTER — Encounter: Payer: 59 | Attending: Student | Admitting: Dietician

## 2017-06-21 ENCOUNTER — Encounter: Payer: Self-pay | Admitting: Dietician

## 2017-06-21 VITALS — Ht 65.0 in | Wt 221.3 lb

## 2017-06-21 DIAGNOSIS — Z6836 Body mass index (BMI) 36.0-36.9, adult: Secondary | ICD-10-CM

## 2017-06-21 DIAGNOSIS — R739 Hyperglycemia, unspecified: Secondary | ICD-10-CM

## 2017-06-21 DIAGNOSIS — Z6838 Body mass index (BMI) 38.0-38.9, adult: Secondary | ICD-10-CM | POA: Insufficient documentation

## 2017-06-21 DIAGNOSIS — E6609 Other obesity due to excess calories: Secondary | ICD-10-CM | POA: Diagnosis not present

## 2017-06-21 DIAGNOSIS — Z713 Dietary counseling and surveillance: Secondary | ICD-10-CM | POA: Diagnosis not present

## 2017-06-21 NOTE — Patient Instructions (Signed)
   Plan for a healthy snack at work, such as Light Greek yogurt, 1/4 cup nuts, lowfat cheese, fruit, whole grain cereal, etc.   1 diet coke a day is fine, as is coffee with 1 creamer.  Keep portions of starchy foods to 1 cup (fist size) or less with each meal.  Plan for meals to include a protein food, small portion of starch or starchy vegetable, and a "free" vegetable and/or fruit.   Add some exercises during the day to boost metabolism and increase strength.

## 2017-06-21 NOTE — Progress Notes (Signed)
Medical Nutrition Therapy: Visit start time: 1140  end time: 1250  Assessment:  Diagnosis: obesity Past medical history: hyperglycemia Psychosocial issues/ stress concerns: patient reports high stress level, feels she is dealing well with stress Preferred learning method:  . Auditory . Visual . Hands-on  Current weight: 221.3lbs  Height: 5'5" Medications, supplements: reconciled list in medical record  Progress and evaluation: Patient reports she had gestational diabetes 4 years ago; no BG issues since then until recent increase. She wants to lose weight to reduce risk for Type 2 diabetes, and to improve overall energy and health. She has already been working to decrease her intake of sugar, and limiting some food portions.   Physical activity: walking on the job  Dietary Intake:  Usual eating pattern includes 2 meals and 1 snacks per day. Dining out frequency: 0-1 meals per week, 1-2 times per month.  Breakfast: usually none. Works nights and eats when arriving home late at night Snack: none Lunch: noon-- at home loves pasta, salad with oil and vinegar dressing Snack: french fries (free at work) -- none in past 2 days Supper: usually late after work, 11pm. 11/12-- spaghetti, meatballs, salad, 3 breadsticks (husband cooked) Snack: none Beverages: water, 1 diet coke daily, coffee with 1 creamer no sugar  Nutrition Care Education: Topics covered: weight management, diabetes prevention Basic nutrition: basic food groups, appropriate nutrient balance, appropriate meal and snack schedule, general nutrition guidelines    Weight control: importance of low fat and low sugar food choices, portion control, plate method for planning balanced meals and examples of healthy balanced meals and snacks. Diabetes prevention: appropriate meal and snack schedule, appropriate carb intake and balance, healthy carb choices, role of exercise in BG control and weight control. Discussed importance of portion  control, moderation, and balanced nutrition.   Nutritional Diagnosis:  [place]-3.3 Overweight/obesity As related to excess calories.  As evidenced by BMI 36.8, patient report.  Intervention: Instruction as noted above.    Set goals with direction from patient.   Encouraged manageable and sustainable changes.    Answered questions regarding specific food choices.  Education Materials given:  . Plate Planner with food lists . Sample menus . Snacking handout  Food Guide for [employer] . Goals/ instructions  Learner/ who was taught:  . Patient   Level of understanding: Marland Kitchen Verbalizes/ demonstrates competency  Demonstrated degree of understanding via:   Teach back Learning barriers: . None  Willingness to learn/ readiness for change: . Eager, change in progress  Monitoring and Evaluation:  Dietary intake, exercise, BG control, and body weight      follow up: 08/16/17

## 2017-07-20 DIAGNOSIS — R1084 Generalized abdominal pain: Secondary | ICD-10-CM | POA: Diagnosis not present

## 2017-07-20 DIAGNOSIS — N200 Calculus of kidney: Secondary | ICD-10-CM | POA: Diagnosis not present

## 2017-07-20 DIAGNOSIS — M5489 Other dorsalgia: Secondary | ICD-10-CM | POA: Diagnosis not present

## 2017-08-07 ENCOUNTER — Emergency Department: Payer: 59

## 2017-08-07 ENCOUNTER — Emergency Department
Admission: EM | Admit: 2017-08-07 | Discharge: 2017-08-07 | Disposition: A | Discharge status: Home / Self Care | Payer: 59 | Attending: Emergency Medicine | Admitting: Emergency Medicine

## 2017-08-07 ENCOUNTER — Encounter: Payer: Self-pay | Admitting: Emergency Medicine

## 2017-08-07 ENCOUNTER — Other Ambulatory Visit: Payer: Self-pay

## 2017-08-07 DIAGNOSIS — Z79899 Other long term (current) drug therapy: Secondary | ICD-10-CM | POA: Insufficient documentation

## 2017-08-07 DIAGNOSIS — R102 Pelvic and perineal pain: Secondary | ICD-10-CM | POA: Diagnosis not present

## 2017-08-07 DIAGNOSIS — R1030 Lower abdominal pain, unspecified: Secondary | ICD-10-CM | POA: Diagnosis not present

## 2017-08-07 DIAGNOSIS — R109 Unspecified abdominal pain: Secondary | ICD-10-CM | POA: Diagnosis not present

## 2017-08-07 LAB — COMPREHENSIVE METABOLIC PANEL
ALBUMIN: 4.2 g/dL (ref 3.5–5.0)
ALK PHOS: 87 U/L (ref 38–126)
ALT: 28 U/L (ref 14–54)
ANION GAP: 8 (ref 5–15)
AST: 23 U/L (ref 15–41)
BILIRUBIN TOTAL: 1.1 mg/dL (ref 0.3–1.2)
BUN: 7 mg/dL (ref 6–20)
CO2: 26 mmol/L (ref 22–32)
CREATININE: 0.51 mg/dL (ref 0.44–1.00)
Calcium: 9 mg/dL (ref 8.9–10.3)
Chloride: 104 mmol/L (ref 101–111)
GFR calc non Af Amer: >60 mL/min (ref >60)
GLUCOSE: 105 mg/dL — AB (ref 65–99)
Potassium: 3.6 mmol/L (ref 3.5–5.1)
Sodium: 138 mmol/L (ref 135–145)
TOTAL PROTEIN: 7.9 g/dL (ref 6.5–8.1)

## 2017-08-07 LAB — CBC
HCT: 40 % (ref 35.0–47.0)
HEMOGLOBIN: 13.1 g/dL (ref 12.0–16.0)
MCH: 29 pg (ref 26.0–34.0)
MCHC: 32.9 g/dL (ref 32.0–36.0)
MCV: 88.1 fL (ref 80.0–100.0)
PLATELETS: 283 10*3/uL (ref 150–440)
RBC: 4.54 MIL/uL (ref 3.80–5.20)
RDW: 14.2 % (ref 11.5–14.5)
WBC: 7.9 10*3/uL (ref 3.6–11.0)

## 2017-08-07 LAB — URINALYSIS, COMPLETE (UACMP) WITH MICROSCOPIC
BILIRUBIN URINE: NEGATIVE
Bacteria, UA: NONE SEEN
GLUCOSE, UA: NEGATIVE mg/dL
Ketones, ur: NEGATIVE mg/dL
Leukocytes, UA: NEGATIVE
NITRITE: NEGATIVE
PH: 6 (ref 5.0–8.0)
Protein, ur: NEGATIVE mg/dL
SPECIFIC GRAVITY, URINE: 1 — AB (ref 1.005–1.030)
WBC UA: NONE SEEN WBC/hpf (ref 0–5)

## 2017-08-07 LAB — LIPASE, BLOOD: Lipase: 22 U/L (ref 11–51)

## 2017-08-07 LAB — POCT PREGNANCY, URINE: PREG TEST UR: NEGATIVE

## 2017-08-07 MED ORDER — DOCUSATE SODIUM 100 MG PO CAPS: 100.0000 mg | ORAL_CAPSULE | Freq: Two times a day (BID) | ORAL | 0 refills | Status: AC

## 2017-08-07 MED ORDER — IBUPROFEN 600 MG PO TABS: 600.0000 mg | ORAL_TABLET | Freq: Four times a day (QID) | ORAL | 0 refills | Status: DC | PRN

## 2017-08-07 NOTE — ED Triage Notes (Signed)
Pt reports lower and umbilical admominal pain for one month with associated nausea. Pt reports has appt in 6 weeks for colonoscopy. Pt reports pain worse today.

## 2017-08-07 NOTE — ED Provider Notes (Signed)
[facility] Emergency Department Provider Note ____________________________________________   First MD Initiated Contact with Patient 08/07/17 1450     (approximate)  I have reviewed the triage vital signs and the nursing notes.   HISTORY  Chief Complaint Abdominal Pain    HPI Janet Harmon is a 46 y.o. female with past medical history as noted below who presents with abdominal pain, intermittent, lasting for approximately 30 minutes at a time and occurring a few times per day, with the duration over the last month.  It is associated with nausea at times.  Patient states the pain was somewhat more severe today which is why she decided to come to the emergency department.  She has been seen by her urologist, who felt that there was nothing going on from a urologic perspective and sent her for a colonoscopy which is to happen in 2 weeks.  Patient denies any associated fever chills, vomiting or diarrhea, vaginal bleeding or discharge, or urinary symptoms.  No prior history of this pain.  Patient states that she is somewhat constipated; she states she has a bowel movement usually every day but it is hard and sometimes she needs to strain.  Past Medical History:  Diagnosis Date  . Kidney stone     Patient Active Problem List   Diagnosis Date Noted  . Obesity, Class II, BMI 35-39.9 11/06/2015    Past Surgical History:  Procedure Laterality Date  . CESAREAN SECTION    . EXTRACORPOREAL SHOCK WAVE LITHOTRIPSY Left 05/20/2016   Procedure: EXTRACORPOREAL SHOCK WAVE LITHOTRIPSY (ESWL);  Surgeon: Royston Cowper, MD;  Location: [facility] ORS;  Service: Urology;  Laterality: Left;  . TUBAL LIGATION    . WISDOM TOOTH EXTRACTION      Prior to Admission medications   Medication Sig Start Date End Date Taking? Authorizing Provider  Cyanocobalamin (VITAMIN B-12 CR PO) Take by mouth.    [provider]  docusate sodium (COLACE) 100 MG capsule Take 1 capsule (100 mg  total) by mouth 2 (two) times daily for 15 days. 08/07/17 08/22/17  Arta Silence, MD  ibuprofen (ADVIL,MOTRIN) 600 MG tablet Take 1 tablet (600 mg total) by mouth every 6 (six) hours as needed. 08/07/17   Arta Silence, MD  Vitamin D, Ergocalciferol, (DRISDOL) 50000 units CAPS capsule Take by mouth. 06/03/17   [provider]    Allergies Patient has no known allergies.  No family history on file.  Social History Social History   Tobacco Use  . Smoking status: Never Smoker  . Smokeless tobacco: Never Used  Substance Use Topics  . Alcohol use: No  . Drug use: No    Review of Systems  Constitutional: No fever/chills.  Eyes: No redness. ENT: No sore throat. Cardiovascular: Denies chest pain. Respiratory: Denies shortness of breath. Gastrointestinal: For nausea, no vomiting Genitourinary: Negative for dysuria.  Musculoskeletal: Negative for back pain. Skin: Negative for rash. Neurological: Negative for headache.    ____________________________________________   PHYSICAL EXAM:  VITAL SIGNS: ED Triage Vitals  Enc Vitals Group     BP 08/07/17 1340 130/74     Pulse Rate 08/07/17 1340 77     Resp 08/07/17 1340 18     Temp 08/07/17 1340 98.2 F (36.8 C)     Temp Source 08/07/17 1340 Oral     SpO2 08/07/17 1340 99 %     Weight 08/07/17 1341 200 lb (90.7 kg)     Height 08/07/17 1341 5\' 5"  (1.651 m)  Head Circumference --      Peak Flow --      Pain Score 08/07/17 1340 8     Pain Loc --      Pain Edu? --      Excl. in [place]? --     Constitutional: Alert and oriented. Well appearing and in no acute distress. Eyes: Conjunctivae are normal.  Head: Atraumatic.  Bilateral TMs with no erythema or swelling. Nose: No congestion/rhinnorhea. Mouth/Throat: Mucous membranes are moist.   Neck: Normal range of motion.  Cardiovascular: Good peripheral circulation. Respiratory: Normal respiratory effort.   Gastrointestinal: Soft and nontender. No  distention.  Genitourinary: No CVA tenderness. Musculoskeletal: Extremities warm and well perfused.  Neurologic:  Normal speech and language. No gross focal neurologic deficits are appreciated.  Skin:  Skin is warm and dry. No rash noted. Psychiatric: Mood and affect are normal. Speech and behavior are normal.  ____________________________________________   LABS (all labs ordered are listed, but only abnormal results are displayed)  Labs Reviewed  COMPREHENSIVE METABOLIC PANEL - Abnormal; Notable for the following components:      Result Value   Glucose, Bld 105 (*)    All other components within normal limits  URINALYSIS, COMPLETE (UACMP) WITH MICROSCOPIC - Abnormal; Notable for the following components:   Color, Urine STRAW (*)    APPearance HAZY (*)    Specific Gravity, Urine 1.000 (*)    Hgb urine dipstick SMALL (*)    Squamous Epithelial / LPF 0-5 (*)    All other components within normal limits  LIPASE, BLOOD  CBC  POC URINE PREG, ED  POCT PREGNANCY, URINE   ____________________________________________  EKG   ____________________________________________  RADIOLOGY  US pelvis: No acute findings.  ____________________________________________   PROCEDURES  Procedure(s) performed: No    Critical Care performed: No ____________________________________________   INITIAL IMPRESSION / ASSESSMENT AND PLAN / ED COURSE  Pertinent labs & imaging results that were available during my care of the patient were reviewed by me and considered in my medical decision making (see chart for details).  46 year old female with past medical history as noted above presents with 1 month of intermittent lower abdominal pain, primarily in the pelvic area and sometimes rating up to the umbilicus or the left lower quadrant.  She reports hard stools, and some nausea associated with the pain, but otherwise negative review of systems.  No vaginal bleeding or discharge.  On exam, the  patient is extremely well-appearing, vital signs are normal, and the remainder the exam is unremarkable; her abdomen is soft and nontender at this time.  Differential includes subacute gynecologic etiology such as fibroids, cyst, or endometriosis, versus constipation.  There is no clinical evidence for torsion given the time course and the patient's well appearance.  I also have no reason to suspect colitis, diverticulitis, or other acute gastrointestinal process given the intermittent pain, lack of associated symptoms, and the benign exam.  Plan for pelvic ultrasound, and if negative I will discharge with a stool softener and have the patient follow-up for her colonoscopy in 2 weeks as scheduled.    ----------------------------------------- 5:00 PM on 08/07/2017 -----------------------------------------  Ultrasound shows no acute findings.  The UA and remainder of the lab workup are unremarkable.  Patient continues to appear comfortable.  At this time there is no indication for additional emergent testing or observation in the emergency department.  We will discharge with a stool softener in case the pain is caused by constipation, and patient has the appointment  to get the colonoscopy done in 2 weeks.  I gave the patient discharge instructions and return precautions, and she expressed understanding.  ____________________________________________   FINAL CLINICAL IMPRESSION(S) / ED DIAGNOSES  Final diagnoses:  Pelvic pain  Lower abdominal pain      NEW MEDICATIONS STARTED DURING THIS VISIT:  This SmartLink is deprecated. Use AVSMEDLIST instead to display the medication list for a patient.   Note:  This document was prepared using Dragon voice recognition software and may include unintentional dictation errors.    Arta Silence, MD 08/07/17 [phone]

## 2017-08-07 NOTE — ED Notes (Signed)
Esignature pad not working, pt understands DC instructions and follow up precautions.  Denies any pain at this time. Ambulatory to lobby

## 2017-08-07 NOTE — Discharge Instructions (Signed)
Return to the emergency department immediately for new, worsening, or persistent severe abdominal pain, vomiting, fever chills, weakness, blood in the stool, vaginal bleeding or discharge, or any other new or worsening symptoms that concern you.  You should follow-up with your primary care doctor within the next few weeks, and to go to the colonoscopy as scheduled in 2 weeks.  You can take the Colace prescribed today to soften the stool, and may take the Ibuprofen for pain.

## 2017-08-12 DIAGNOSIS — H9202 Otalgia, left ear: Secondary | ICD-10-CM | POA: Diagnosis not present

## 2017-08-16 ENCOUNTER — Ambulatory Visit: Payer: 59 | Admitting: Dietician

## 2017-08-19 DIAGNOSIS — D485 Neoplasm of uncertain behavior of skin: Secondary | ICD-10-CM | POA: Diagnosis not present

## 2017-08-19 DIAGNOSIS — L718 Other rosacea: Secondary | ICD-10-CM | POA: Diagnosis not present

## 2017-08-19 DIAGNOSIS — D2239 Melanocytic nevi of other parts of face: Secondary | ICD-10-CM | POA: Diagnosis not present

## 2017-08-19 DIAGNOSIS — D239 Other benign neoplasm of skin, unspecified: Secondary | ICD-10-CM | POA: Diagnosis not present

## 2017-08-19 DIAGNOSIS — L80 Vitiligo: Secondary | ICD-10-CM | POA: Diagnosis not present

## 2017-09-05 DIAGNOSIS — E538 Deficiency of other specified B group vitamins: Secondary | ICD-10-CM | POA: Diagnosis not present

## 2017-09-05 DIAGNOSIS — E559 Vitamin D deficiency, unspecified: Secondary | ICD-10-CM | POA: Diagnosis not present

## 2017-09-05 DIAGNOSIS — R7303 Prediabetes: Secondary | ICD-10-CM | POA: Diagnosis not present

## 2017-09-12 DIAGNOSIS — E559 Vitamin D deficiency, unspecified: Secondary | ICD-10-CM | POA: Diagnosis not present

## 2017-09-12 DIAGNOSIS — E538 Deficiency of other specified B group vitamins: Secondary | ICD-10-CM | POA: Diagnosis not present

## 2017-09-29 ENCOUNTER — Encounter: Payer: Self-pay | Admitting: Dietician

## 2017-09-29 NOTE — Progress Notes (Signed)
Have not heard back from patient to reschedule; sent letter to referring provider.

## 2017-11-10 DIAGNOSIS — Z86010 Personal history of colonic polyps: Secondary | ICD-10-CM | POA: Diagnosis not present

## 2017-11-10 DIAGNOSIS — R1032 Left lower quadrant pain: Secondary | ICD-10-CM | POA: Diagnosis not present

## 2017-11-10 DIAGNOSIS — K5900 Constipation, unspecified: Secondary | ICD-10-CM | POA: Diagnosis not present

## 2017-11-10 DIAGNOSIS — N200 Calculus of kidney: Secondary | ICD-10-CM | POA: Diagnosis not present

## 2017-12-13 DIAGNOSIS — E782 Mixed hyperlipidemia: Secondary | ICD-10-CM | POA: Diagnosis not present

## 2017-12-13 DIAGNOSIS — E538 Deficiency of other specified B group vitamins: Secondary | ICD-10-CM | POA: Diagnosis not present

## 2017-12-13 DIAGNOSIS — E559 Vitamin D deficiency, unspecified: Secondary | ICD-10-CM | POA: Diagnosis not present

## 2017-12-19 DIAGNOSIS — E559 Vitamin D deficiency, unspecified: Secondary | ICD-10-CM | POA: Diagnosis not present

## 2017-12-19 DIAGNOSIS — R7303 Prediabetes: Secondary | ICD-10-CM | POA: Insufficient documentation

## 2017-12-19 DIAGNOSIS — F411 Generalized anxiety disorder: Secondary | ICD-10-CM | POA: Insufficient documentation

## 2017-12-19 DIAGNOSIS — E538 Deficiency of other specified B group vitamins: Secondary | ICD-10-CM | POA: Insufficient documentation

## 2018-02-14 DIAGNOSIS — R1011 Right upper quadrant pain: Secondary | ICD-10-CM | POA: Diagnosis not present

## 2018-02-14 DIAGNOSIS — M545 Low back pain: Secondary | ICD-10-CM | POA: Diagnosis not present

## 2018-02-14 DIAGNOSIS — M25511 Pain in right shoulder: Secondary | ICD-10-CM | POA: Diagnosis not present

## 2018-02-27 ENCOUNTER — Other Ambulatory Visit: Payer: Self-pay | Admitting: Student

## 2018-02-27 DIAGNOSIS — R1011 Right upper quadrant pain: Secondary | ICD-10-CM

## 2018-03-02 ENCOUNTER — Ambulatory Visit
Admission: RE | Admit: 2018-03-02 | Discharge: 2018-03-02 | Disposition: A | Discharge status: Home / Self Care | Payer: 59 | Source: Ambulatory Visit | Attending: Student | Admitting: Student

## 2018-03-02 DIAGNOSIS — K760 Fatty (change of) liver, not elsewhere classified: Secondary | ICD-10-CM | POA: Insufficient documentation

## 2018-03-02 DIAGNOSIS — R1011 Right upper quadrant pain: Secondary | ICD-10-CM | POA: Diagnosis not present

## 2018-04-11 DIAGNOSIS — Z8371 Family history of colonic polyps: Secondary | ICD-10-CM | POA: Diagnosis not present

## 2018-04-11 DIAGNOSIS — K635 Polyp of colon: Secondary | ICD-10-CM | POA: Diagnosis not present

## 2018-04-11 DIAGNOSIS — D122 Benign neoplasm of ascending colon: Secondary | ICD-10-CM | POA: Diagnosis not present

## 2018-04-11 DIAGNOSIS — D123 Benign neoplasm of transverse colon: Secondary | ICD-10-CM | POA: Diagnosis not present

## 2018-04-11 DIAGNOSIS — K621 Rectal polyp: Secondary | ICD-10-CM | POA: Diagnosis not present

## 2018-04-11 DIAGNOSIS — Z1211 Encounter for screening for malignant neoplasm of colon: Secondary | ICD-10-CM | POA: Diagnosis not present

## 2018-04-11 DIAGNOSIS — K640 First degree hemorrhoids: Secondary | ICD-10-CM | POA: Diagnosis not present

## 2018-04-11 DIAGNOSIS — Z86010 Personal history of colonic polyps: Secondary | ICD-10-CM | POA: Diagnosis not present

## 2018-05-09 DIAGNOSIS — M7989 Other specified soft tissue disorders: Secondary | ICD-10-CM | POA: Diagnosis not present

## 2018-05-09 DIAGNOSIS — M722 Plantar fascial fibromatosis: Secondary | ICD-10-CM | POA: Diagnosis not present

## 2018-05-09 DIAGNOSIS — K760 Fatty (change of) liver, not elsewhere classified: Secondary | ICD-10-CM | POA: Insufficient documentation

## 2018-06-04 IMAGING — CT CT RENAL STONE PROTOCOL
2 of 4 series · 16 of 46 positions shown, 18 images · non-contrast
Comparison: None.

CLINICAL DATA: 45 y/o F; right flank pain radiating to the right
lower quadrant.

EXAM:
CT ABDOMEN AND PELVIS WITHOUT CONTRAST
TECHNIQUE: Multidetector CT imaging of the abdomen and pelvis was performed
following the standard protocol without IV contrast.

[Series 2: stone full standard · axial · 0.80mm/px · z∈[-317,+108]mm · 13 of 93 slices shown, 15 images]
[im 4/93  soft-tissue]
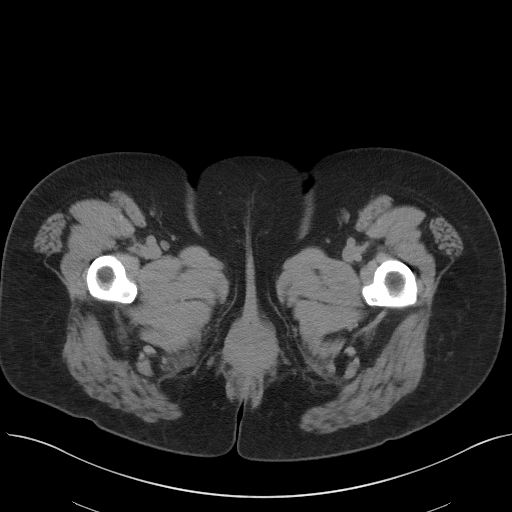
[im 4/93  bone]
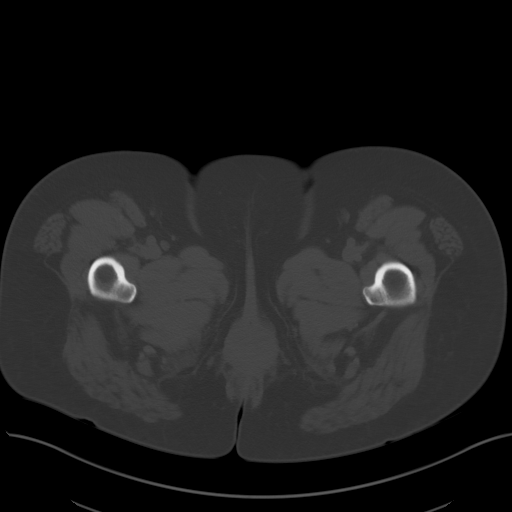
[im 12/93  soft-tissue]
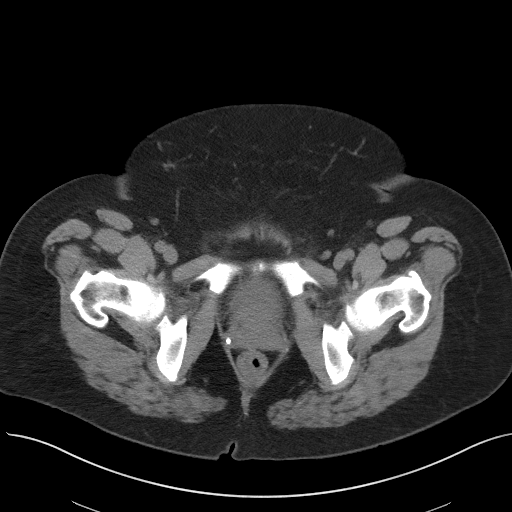
[im 19/93  soft-tissue]
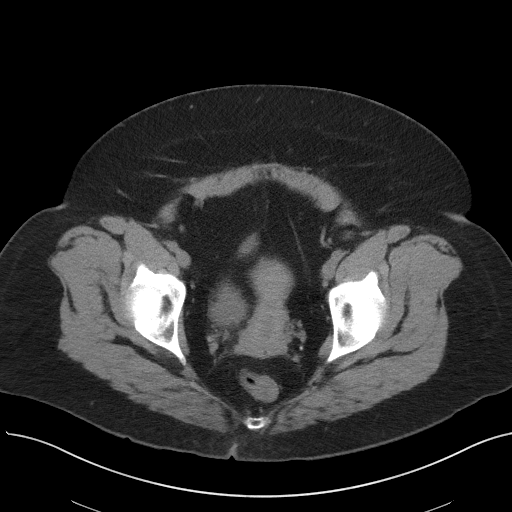
[im 26/93  soft-tissue]
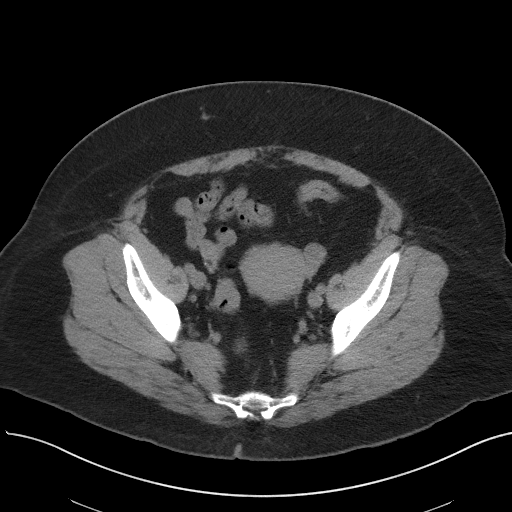
[im 34/93  soft-tissue]
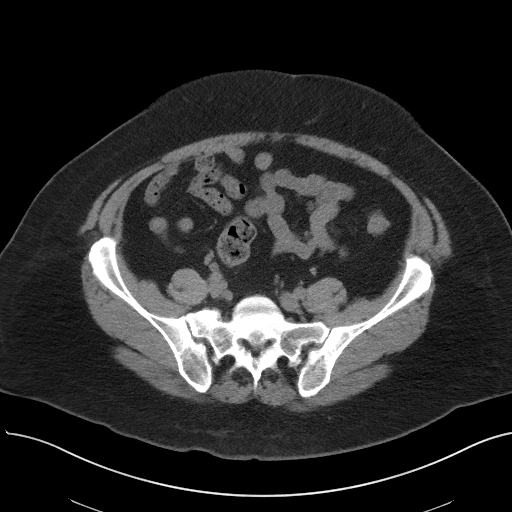
[im 41/93  soft-tissue]
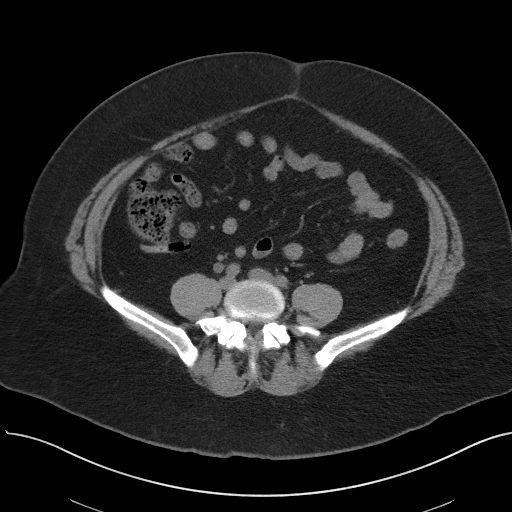
[im 48/93  soft-tissue]
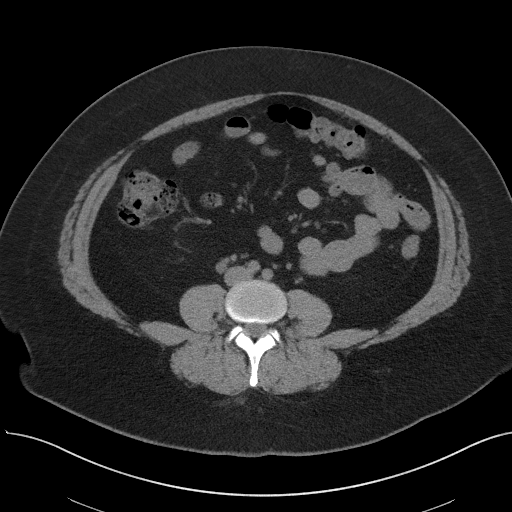
[im 52/93  soft-tissue]
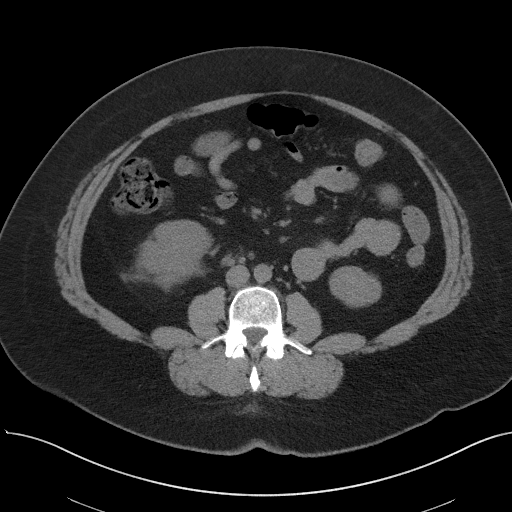
[im 59/93  soft-tissue]
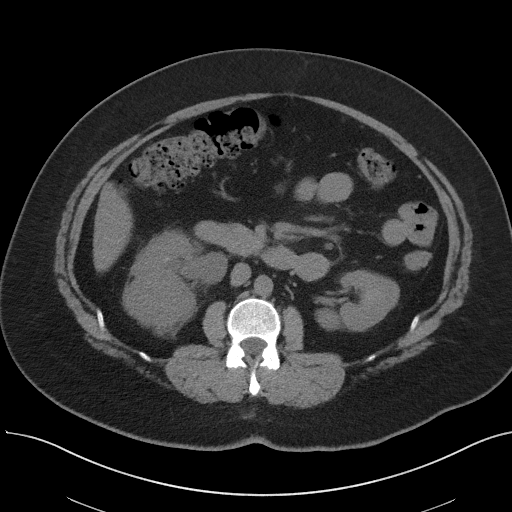
[im 59/93  bone]
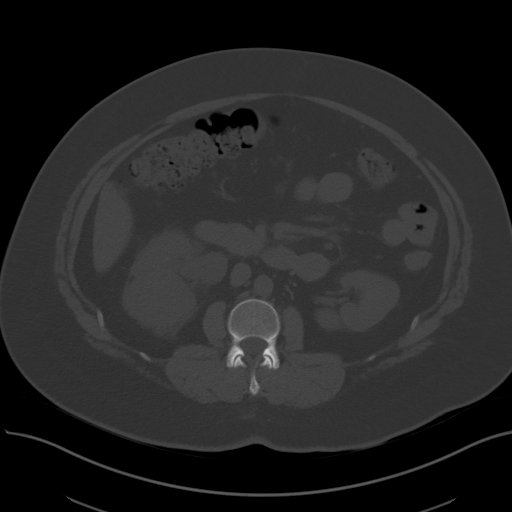
[im 67/93  soft-tissue]
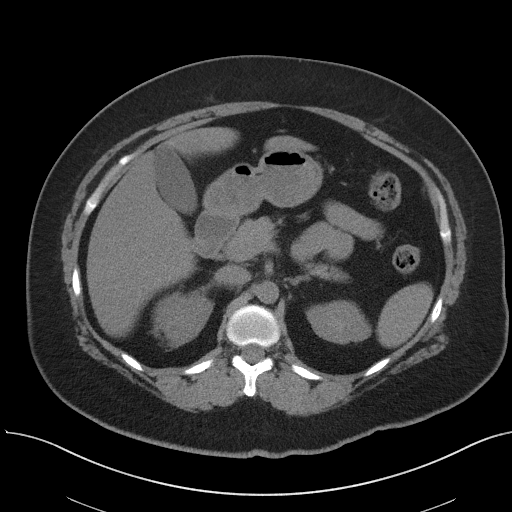
[im 74/93  soft-tissue]
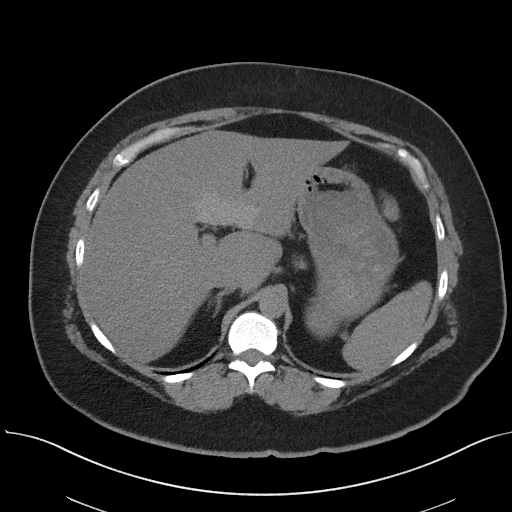
[im 81/93  soft-tissue]
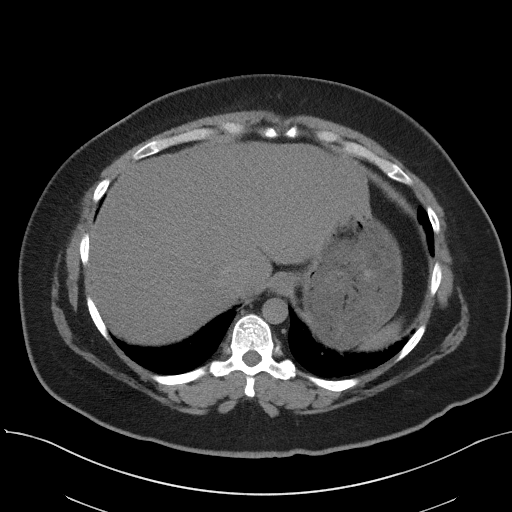
[im 89/93  soft-tissue]
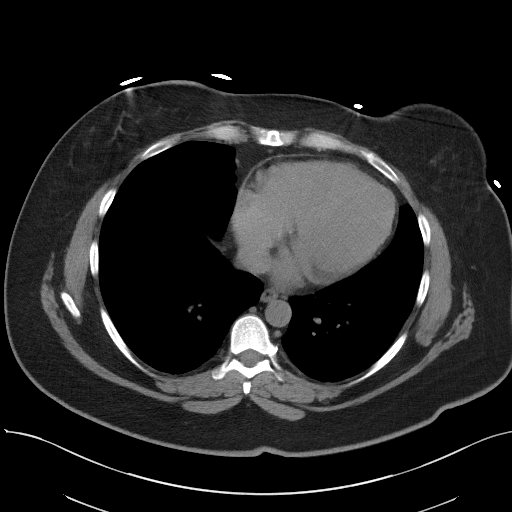

[Series 5: coronal · coronal · 0.79mm/px · 3 of 152 slices shown]
[im 51/152  soft-tissue]
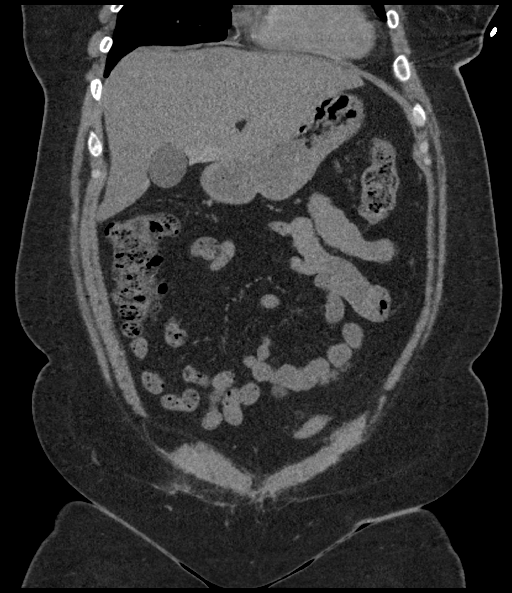
[im 68/152  soft-tissue]
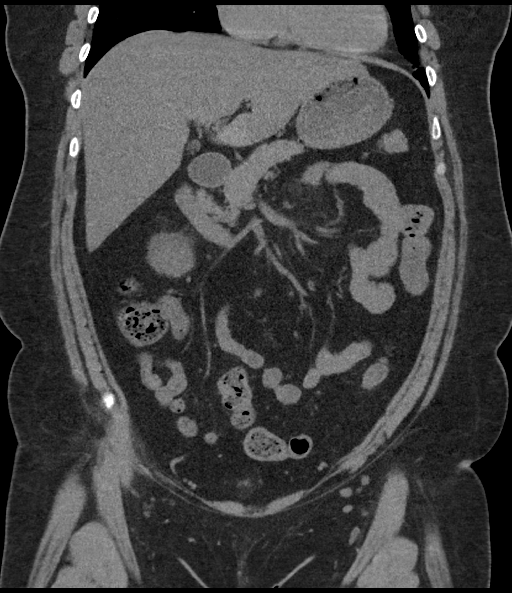
[im 84/152  soft-tissue]
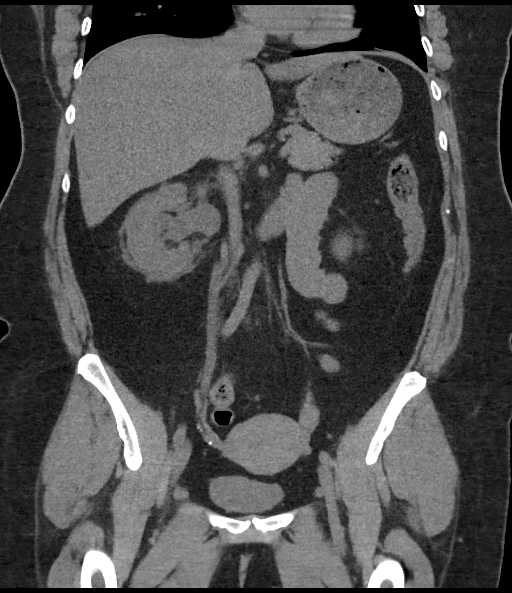

[16 of 46 positions shown; findings below may reference images not displayed]

FINDINGS: Lower chest: No acute abnormality.

Hepatobiliary: No focal liver abnormality is seen. No gallstones,
gallbladder wall thickening, or biliary dilatation.

Pancreas: Unremarkable. No pancreatic ductal dilatation or
surrounding inflammatory changes.

Spleen: Normal in size without focal abnormality.

Adrenals/Urinary Tract: Normal adrenal glands. Multiple punctate
nonobstructing stones in the kidneys bilaterally.

Mild right hydronephrosis and moderate right-sided perinephric
stranding and fluid secondary to a 3 mm obstructing stone likely
within the right ureterovesicular junction projecting in the right
bladder trigone (series 2, image 79).

No left hydronephrosis.  Otherwise normal bladder.

Stomach/Bowel: Stomach is within normal limits. Appendix appears
normal. No evidence of bowel wall thickening, distention, or
inflammatory changes.

Vascular/Lymphatic: No significant vascular findings are present. No
enlarged abdominal or pelvic lymph nodes.

Reproductive: Uterus and bilateral adnexa are unremarkable.

Other: No abdominal wall hernia or abnormality. No abdominopelvic
ascites.

Musculoskeletal: No acute or significant osseous findings.
Articulation of right L5 transverse process with sacral ala.
IMPRESSION: Mild right hydronephrosis and moderate right perinephric stranding
secondary to a 3 mm stone likely obstructing the right intramural
ureterovesicular junction.

By: Mwintome Lantey M.D.

## 2018-06-14 DIAGNOSIS — R300 Dysuria: Secondary | ICD-10-CM | POA: Diagnosis not present

## 2018-06-14 DIAGNOSIS — Z23 Encounter for immunization: Secondary | ICD-10-CM | POA: Diagnosis not present

## 2018-06-14 DIAGNOSIS — N390 Urinary tract infection, site not specified: Secondary | ICD-10-CM | POA: Diagnosis not present

## 2018-06-20 DIAGNOSIS — R300 Dysuria: Secondary | ICD-10-CM | POA: Diagnosis not present

## 2018-06-20 DIAGNOSIS — M545 Low back pain: Secondary | ICD-10-CM | POA: Diagnosis not present

## 2018-06-20 DIAGNOSIS — Z87442 Personal history of urinary calculi: Secondary | ICD-10-CM | POA: Diagnosis not present

## 2018-06-28 ENCOUNTER — Other Ambulatory Visit: Payer: Self-pay | Admitting: Student

## 2018-06-28 DIAGNOSIS — Z1231 Encounter for screening mammogram for malignant neoplasm of breast: Secondary | ICD-10-CM

## 2018-06-29 ENCOUNTER — Encounter (INDEPENDENT_AMBULATORY_CARE_PROVIDER_SITE_OTHER): Payer: Self-pay

## 2018-06-29 ENCOUNTER — Ambulatory Visit
Admission: RE | Admit: 2018-06-29 | Discharge: 2018-06-29 | Disposition: A | Discharge status: Home / Self Care | Payer: 59 | Source: Ambulatory Visit | Attending: Student | Admitting: Student

## 2018-06-29 DIAGNOSIS — N201 Calculus of ureter: Secondary | ICD-10-CM | POA: Diagnosis not present

## 2018-06-29 DIAGNOSIS — R110 Nausea: Secondary | ICD-10-CM | POA: Diagnosis not present

## 2018-06-29 DIAGNOSIS — Z1231 Encounter for screening mammogram for malignant neoplasm of breast: Secondary | ICD-10-CM | POA: Diagnosis not present

## 2018-06-29 DIAGNOSIS — N23 Unspecified renal colic: Secondary | ICD-10-CM | POA: Diagnosis not present

## 2018-07-05 ENCOUNTER — Ambulatory Visit (INDEPENDENT_AMBULATORY_CARE_PROVIDER_SITE_OTHER): Payer: 59 | Admitting: Advanced Practice Midwife

## 2018-07-05 ENCOUNTER — Encounter: Payer: Self-pay | Admitting: Advanced Practice Midwife

## 2018-07-05 VITALS — BP 118/70 | Ht 64.0 in | Wt 239.0 lb

## 2018-07-05 DIAGNOSIS — Z01419 Encounter for gynecological examination (general) (routine) without abnormal findings: Secondary | ICD-10-CM

## 2018-07-05 DIAGNOSIS — Z0000 Encounter for general adult medical examination without abnormal findings: Secondary | ICD-10-CM

## 2018-07-05 DIAGNOSIS — R300 Dysuria: Secondary | ICD-10-CM | POA: Diagnosis not present

## 2018-07-05 LAB — POCT URINALYSIS DIPSTICK
BILIRUBIN UA: NEGATIVE
GLUCOSE UA: NEGATIVE
KETONES UA: NEGATIVE
Nitrite, UA: NEGATIVE
Protein, UA: NEGATIVE
Spec Grav, UA: 1.02 (ref 1.010–1.025)
Urobilinogen, UA: 0.2 E.U./dL
pH, UA: 6 (ref 5.0–8.0)

## 2018-07-05 NOTE — Patient Instructions (Signed)
Preventive Care 40-64 Years, Female Preventive care refers to lifestyle choices and visits with your health care provider that can promote health and wellness. What does preventive care include?  A yearly physical exam. This is also called an annual well check.  Dental exams once or twice a year.  Routine eye exams. Ask your health care provider how often you should have your eyes checked.  Personal lifestyle choices, including: ? Daily care of your teeth and gums. ? Regular physical activity. ? Eating a healthy diet. ? Avoiding tobacco and drug use. ? Limiting alcohol use. ? Practicing safe sex. ? Taking low-dose aspirin daily starting at age 58. ? Taking vitamin and mineral supplements as recommended by your health care provider. What happens during an annual well check? The services and screenings done by your health care provider during your annual well check will depend on your age, overall health, lifestyle risk factors, and family history of disease. Counseling Your health care provider may ask you questions about your:  Alcohol use.  Tobacco use.  Drug use.  Emotional well-being.  Home and relationship well-being.  Sexual activity.  Eating habits.  Work and work Statistician.  Method of birth control.  Menstrual cycle.  Pregnancy history.  Screening You may have the following tests or measurements:  Height, weight, and BMI.  Blood pressure.  Lipid and cholesterol levels. These may be checked every 5 years, or more frequently if you are over 81 years old.  Skin check.  Lung cancer screening. You may have this screening every year starting at age 78 if you have a 30-pack-year history of smoking and currently smoke or have quit within the past 15 years.  Fecal occult blood test (FOBT) of the stool. You may have this test every year starting at age 65.  Flexible sigmoidoscopy or colonoscopy. You may have a sigmoidoscopy every 5 years or a colonoscopy  every 10 years starting at age 30.  Hepatitis C blood test.  Hepatitis B blood test.  Sexually transmitted disease (STD) testing.  Diabetes screening. This is done by checking your blood sugar (glucose) after you have not eaten for a while (fasting). You may have this done every 1-3 years.  Mammogram. This may be done every 1-2 years. Talk to your health care provider about when you should start having regular mammograms. This may depend on whether you have a family history of breast cancer.  BRCA-related cancer screening. This may be done if you have a family history of breast, ovarian, tubal, or peritoneal cancers.  Pelvic exam and Pap test. This may be done every 3 years starting at age 80. Starting at age 36, this may be done every 5 years if you have a Pap test in combination with an HPV test.  Bone density scan. This is done to screen for osteoporosis. You may have this scan if you are at high risk for osteoporosis.  Discuss your test results, treatment options, and if necessary, the need for more tests with your health care provider. Vaccines Your health care provider may recommend certain vaccines, such as:  Influenza vaccine. This is recommended every year.  Tetanus, diphtheria, and acellular pertussis (Tdap, Td) vaccine. You may need a Td booster every 10 years.  Varicella vaccine. You may need this if you have not been vaccinated.  Zoster vaccine. You may need this after age 5.  Measles, mumps, and rubella (MMR) vaccine. You may need at least one dose of MMR if you were born in  1957 or later. You may also need a second dose.  Pneumococcal 13-valent conjugate (PCV13) vaccine. You may need this if you have certain conditions and were not previously vaccinated.  Pneumococcal polysaccharide (PPSV23) vaccine. You may need one or two doses if you smoke cigarettes or if you have certain conditions.  Meningococcal vaccine. You may need this if you have certain  conditions.  Hepatitis A vaccine. You may need this if you have certain conditions or if you travel or work in places where you may be exposed to hepatitis A.  Hepatitis B vaccine. You may need this if you have certain conditions or if you travel or work in places where you may be exposed to hepatitis B.  Haemophilus influenzae type b (Hib) vaccine. You may need this if you have certain conditions.  Talk to your health care provider about which screenings and vaccines you need and how often you need them. This information is not intended to replace advice given to you by your health care provider. Make sure you discuss any questions you have with your health care provider. Document Released: 08/22/2015 Document Revised: 04/14/2016 Document Reviewed: 05/27/2015 Elsevier Interactive Patient Education  2018 Elsevier Inc.  

## 2018-07-05 NOTE — Progress Notes (Addendum)
Patient ID: Janet Harmon, female   DOB: [birth year], 47 y.o.   MRN: [id number]    Gynecology Annual Exam  PCP: Wayland Denis, PA-C  Chief Complaint:  Chief Complaint  Patient presents with  . Gynecologic Exam    ovarian test? PCP recommended and dysuria    History of Present Illness: Patient is a 47 y.o. [id number] presents for annual exam. The patient has complaint today of dysuria in the past few days. She reports some frequency. She was treated recently for UTI with a 3 day course of antibiotics and suspects it was not sufficient treatment. She has no gyn complaints. She requests lab work today as she was recently diagnosed with fatty liver disease. Healthy lifestyle recommendations reviewed.    LMP: Patient's last menstrual period was 06/21/2018 (exact date). Average Interval: regular, 30 days Duration of flow: 3-5 days Heavy Menses: no Clots: no Intermenstrual Bleeding: no Postcoital Bleeding: no Dysmenorrhea: no   The patient is sexually active. She currently uses tubal ligation for contraception. She denies dyspareunia.  The patient does perform self breast exams.  There is no notable family history of breast or ovarian cancer in her family.  The patient wears seatbelts: yes.   The patient has regular exercise: she walks 1 day per week. She denies cardio activity..    The patient denies current symptoms of depression.    Review of Systems: Review of Systems  Constitutional: Negative.   HENT: Negative.   Eyes: Negative.   Respiratory: Negative.   Cardiovascular: Negative.   Gastrointestinal: Negative.   Genitourinary: Positive for dysuria and frequency.  Musculoskeletal: Negative.   Skin: Negative.   Neurological: Negative.   Endo/Heme/Allergies: Negative.   Psychiatric/Behavioral: Negative.     Past Medical History:  Past Medical History:  Diagnosis Date  . Kidney stone     Past Surgical History:  Past Surgical History:  Procedure Laterality Date  .  CESAREAN SECTION    . EXTRACORPOREAL SHOCK WAVE LITHOTRIPSY Left 05/20/2016   Procedure: EXTRACORPOREAL SHOCK WAVE LITHOTRIPSY (ESWL);  Surgeon: Royston Cowper, MD;  Location: [facility] ORS;  Service: Urology;  Laterality: Left;  . TUBAL LIGATION    . WISDOM TOOTH EXTRACTION      Gynecologic History:  Patient's last menstrual period was 06/21/2018 (exact date). Contraception: tubal ligation Last Pap: 1 year ago Results were:  no abnormalities  Last mammogram: 1 week ago Results were: BI-RAD I  Obstetric History: [id number]  Family History:  Family History  Problem Relation Age of Onset  . Prostate cancer Father 24  . Breast cancer Neg Hx     Social History:  Social History   Socioeconomic History  . Marital status: Married    Spouse name: Not on file  . Number of children: Not on file  . Years of education: Not on file  . Highest education level: Not on file  Occupational History  . Not on file  Social Needs  . Financial resource strain: Not on file  . Food insecurity:    Worry: Not on file    Inability: Not on file  . Transportation needs:    Medical: Not on file    Non-medical: Not on file  Tobacco Use  . Smoking status: Never Smoker  . Smokeless tobacco: Never Used  Substance and Sexual Activity  . Alcohol use: No  . Drug use: No  . Sexual activity: Yes    Birth control/protection: Surgical  Lifestyle  . Physical activity:    Days per week:  Not on file    Minutes per session: Not on file  . Stress: Not on file  Relationships  . Social connections:    Talks on phone: Not on file    Gets together: Not on file    Attends religious service: Not on file    Active member of club or organization: Not on file    Attends meetings of clubs or organizations: Not on file    Relationship status: Not on file  . Intimate partner violence:    Fear of current or ex partner: Not on file    Emotionally abused: Not on file    Physically abused: Not on file    Forced sexual  activity: Not on file  Other Topics Concern  . Not on file  Social History Narrative  . Not on file    Allergies:  No Known Allergies  Medications: Prior to Admission medications   Medication Sig Start Date End Date Taking? Authorizing Provider  Cyanocobalamin (VITAMIN B-12 CR PO) Take by mouth.   Yes [provider]  PARoxetine (PAXIL) 20 MG tablet Take by mouth. 09/12/17  Yes [provider]  Vitamin D, Ergocalciferol, (DRISDOL) 50000 units CAPS capsule Take by mouth. 06/03/17  Yes [provider]  ibuprofen (ADVIL,MOTRIN) 600 MG tablet Take 1 tablet (600 mg total) by mouth every 6 (six) hours as needed. Patient not taking: Reported on 07/05/2018 08/07/17   Arta Silence, MD    Physical Exam Vitals: Blood pressure 118/70, height 5\' 4"  (1.626 m), weight 239 lb (108.4 kg), last menstrual period 06/21/2018.  General: NAD HEENT: normocephalic, anicteric Thyroid: no enlargement, no palpable nodules Pulmonary: No increased work of breathing, CTAB Cardiovascular: RRR, distal pulses 2+ Breast: Breast symmetrical, no tenderness, no palpable nodules or masses, no skin or nipple retraction present, no nipple discharge.  No axillary or supraclavicular lymphadenopathy. Abdomen: NABS, soft, non-tender, non-distended.  Umbilicus without lesions.  No hepatomegaly, splenomegaly or masses palpable. No evidence of hernia  Genitourinary: deferred for no concerns/PAP interval Extremities: no edema, erythema, or tenderness Neurologic: Grossly intact Psychiatric: mood appropriate, affect full  Results for Janet, Harmon (MRN [id number]) as of 07/05/2018 17:52  Ref. Range 07/05/2018 14:34  Appearance Unknown Yellow  Bilirubin, UA Unknown Negative  Clarity, UA Unknown Clear  Color, UA Unknown Yellow  Glucose Latest Ref Range: Negative  Negative  Ketones, UA Unknown Negative  Leukocytes, UA Latest Ref Range: Negative  Moderate (2+) (A)  Nitrite, UA Unknown Negative    pH, UA Latest Ref Range: 5.0 - 8.0  6.0  Protein, UA Latest Ref Range: Negative  Negative  Specific Gravity, UA Latest Ref Range: 1.010 - 1.025  1.020  Urobilinogen, UA Latest Ref Range: 0.2 or 1.0 E.U./dL 0.2  RBC, UA Unknown +++  Odor Unknown Strong    Assessment: 47 y.o. [id number] routine annual exam  Plan: Problem List Items Addressed This Visit    None    Visit Diagnoses    Well woman exam without gynecological exam    -  Primary   Blood tests for routine general physical examination       Relevant Orders   Hepatic function panel   Hgb A1c w/o eAG   Lipid Panel With LDL/HDL Ratio   CBC with Differential/Platelet   TSH   Dysuria       Relevant Orders   Urine Culture   POCT Urinalysis Dipstick (Completed)      1) Mammogram - recommend yearly screening mammogram.  Mammogram Is  up to date   2) STI screening  was offered and declined  3) ASCCP guidelines and rationale discussed.  Patient opts for every 3 years screening interval  4) Contraception - the patient is currently using  tubal ligation.  She is happy with her current form of contraception and plans to continue  5) Colonoscopy -- Screening recommended starting at age 60 for average risk individuals, age 36 for individuals deemed at increased risk (including African Americans) and recommended to continue until age 45.  For patient age 70-85 individualized approach is recommended.  Gold standard screening is via colonoscopy, Cologuard screening is an acceptable alternative for patient unwilling or unable to undergo colonoscopy.  "Colorectal cancer screening for average?risk adults: 2018 guideline update from the [place]: A Cancer Journal for Clinicians: Jan 05, 2017   6) Routine healthcare maintenance including cholesterol, diabetes screening discussed Ordered today  7) Return in 1 year (on 07/06/2019).   Rod Can, CNM [facility], [facility] 07/05/2018, 5:14  PM

## 2018-07-06 LAB — CBC WITH DIFFERENTIAL/PLATELET
BASOS: 1 %
Basophils Absolute: 0.1 10*3/uL (ref 0.0–0.2)
EOS (ABSOLUTE): 0.2 10*3/uL (ref 0.0–0.4)
EOS: 3 %
HEMATOCRIT: 37.4 % (ref 34.0–46.6)
HEMOGLOBIN: 12.1 g/dL (ref 11.1–15.9)
IMMATURE GRANS (ABS): 0 10*3/uL (ref 0.0–0.1)
IMMATURE GRANULOCYTES: 0 %
LYMPHS: 30 %
Lymphocytes Absolute: 2.2 10*3/uL (ref 0.7–3.1)
MCH: 27.9 pg (ref 26.6–33.0)
MCHC: 32.4 g/dL (ref 31.5–35.7)
MCV: 86 fL (ref 79–97)
MONOCYTES: 10 %
Monocytes Absolute: 0.8 10*3/uL (ref 0.1–0.9)
NEUTROS PCT: 56 %
Neutrophils Absolute: 4.1 10*3/uL (ref 1.4–7.0)
Platelets: 335 10*3/uL (ref 150–450)
RBC: 4.33 x10E6/uL (ref 3.77–5.28)
RDW: 13.4 % (ref 12.3–15.4)
WBC: 7.5 10*3/uL (ref 3.4–10.8)

## 2018-07-06 LAB — HEPATIC FUNCTION PANEL
ALK PHOS: 106 IU/L (ref 39–117)
ALT: 37 IU/L — ABNORMAL HIGH (ref 0–32)
AST: 26 IU/L (ref 0–40)
Albumin: 4.4 g/dL (ref 3.5–5.5)
Bilirubin Total: 0.5 mg/dL (ref 0.0–1.2)
Bilirubin, Direct: 0.12 mg/dL (ref 0.00–0.40)
TOTAL PROTEIN: 6.9 g/dL (ref 6.0–8.5)

## 2018-07-06 LAB — HGB A1C W/O EAG: HEMOGLOBIN A1C: 6.2 % — AB (ref 4.8–5.6)

## 2018-07-06 LAB — LIPID PANEL WITH LDL/HDL RATIO
CHOLESTEROL TOTAL: 217 mg/dL — AB (ref 100–199)
HDL: 39 mg/dL — ABNORMAL LOW (ref >39)
LDL Calculated: 140 mg/dL — ABNORMAL HIGH (ref 0–99)
LDl/HDL Ratio: 3.6 ratio — ABNORMAL HIGH (ref 0.0–3.2)
Triglycerides: 190 mg/dL — ABNORMAL HIGH (ref 0–149)
VLDL CHOLESTEROL CAL: 38 mg/dL (ref 5–40)

## 2018-07-06 LAB — TSH: TSH: 1.55 u[IU]/mL (ref 0.450–4.500)

## 2018-07-07 LAB — URINE CULTURE

## 2018-07-17 ENCOUNTER — Other Ambulatory Visit: Payer: Self-pay | Admitting: Advanced Practice Midwife

## 2018-07-17 DIAGNOSIS — R300 Dysuria: Secondary | ICD-10-CM

## 2018-07-17 MED ORDER — CEPHALEXIN 500 MG PO CAPS: 500.0000 mg | ORAL_CAPSULE | Freq: Three times a day (TID) | ORAL | 0 refills | Status: DC

## 2018-07-17 NOTE — Progress Notes (Signed)
Spoke with patient and reviewed lab results. She still has some dysuria. Rx sent to pharmacy and she is encouraged to increase hydration.

## 2018-10-08 IMAGING — CR DG CHEST 2V
1 series · 2 of 2 positions shown · non-contrast
Comparison: 11/14/2006

CLINICAL DATA: Chest pain radiates to left arm.

EXAM:
CHEST  2 VIEW

[Series 1: dg chest 2 view · 0.14mm/px · 2 of 2 slices shown]
[im 1/2]
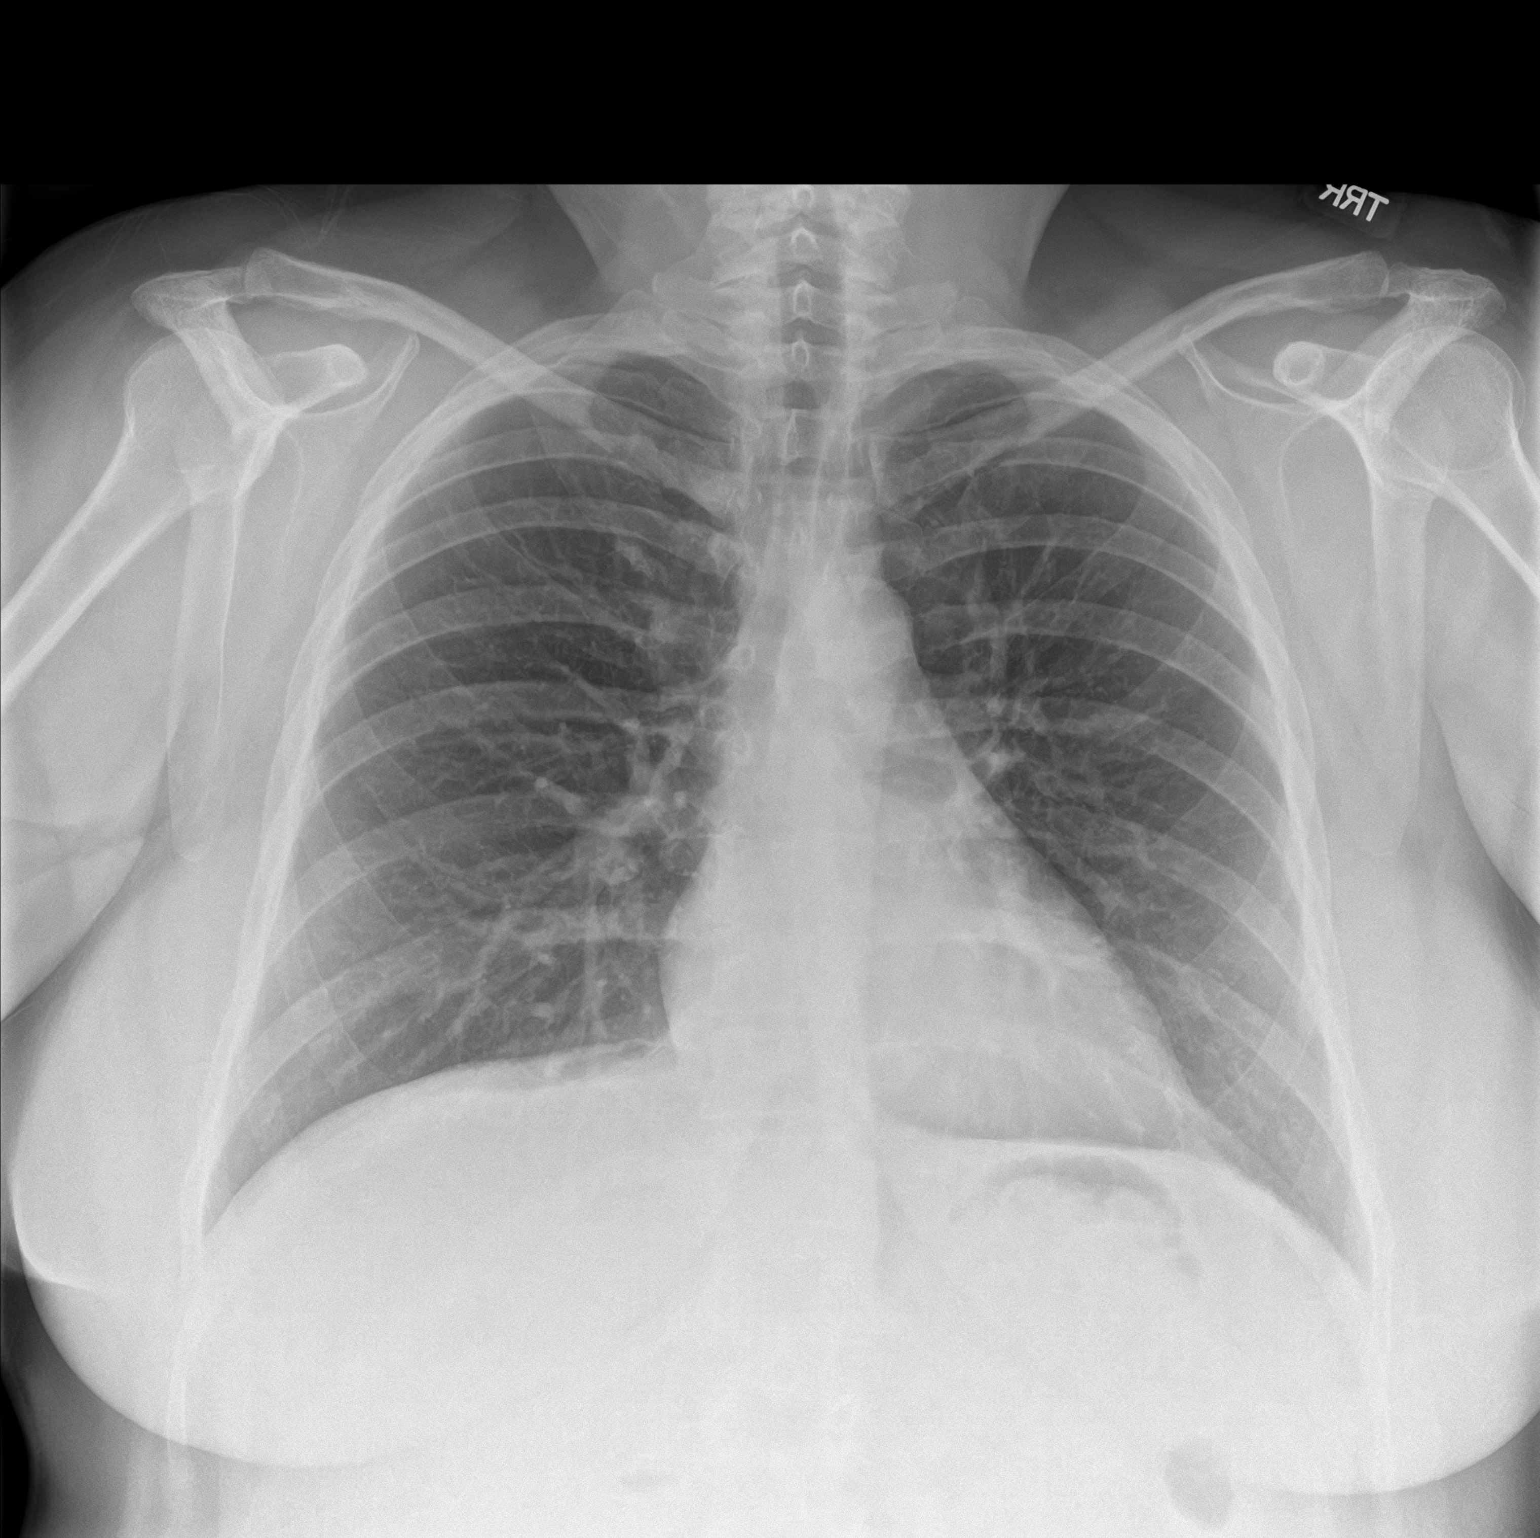
[im 2/2]
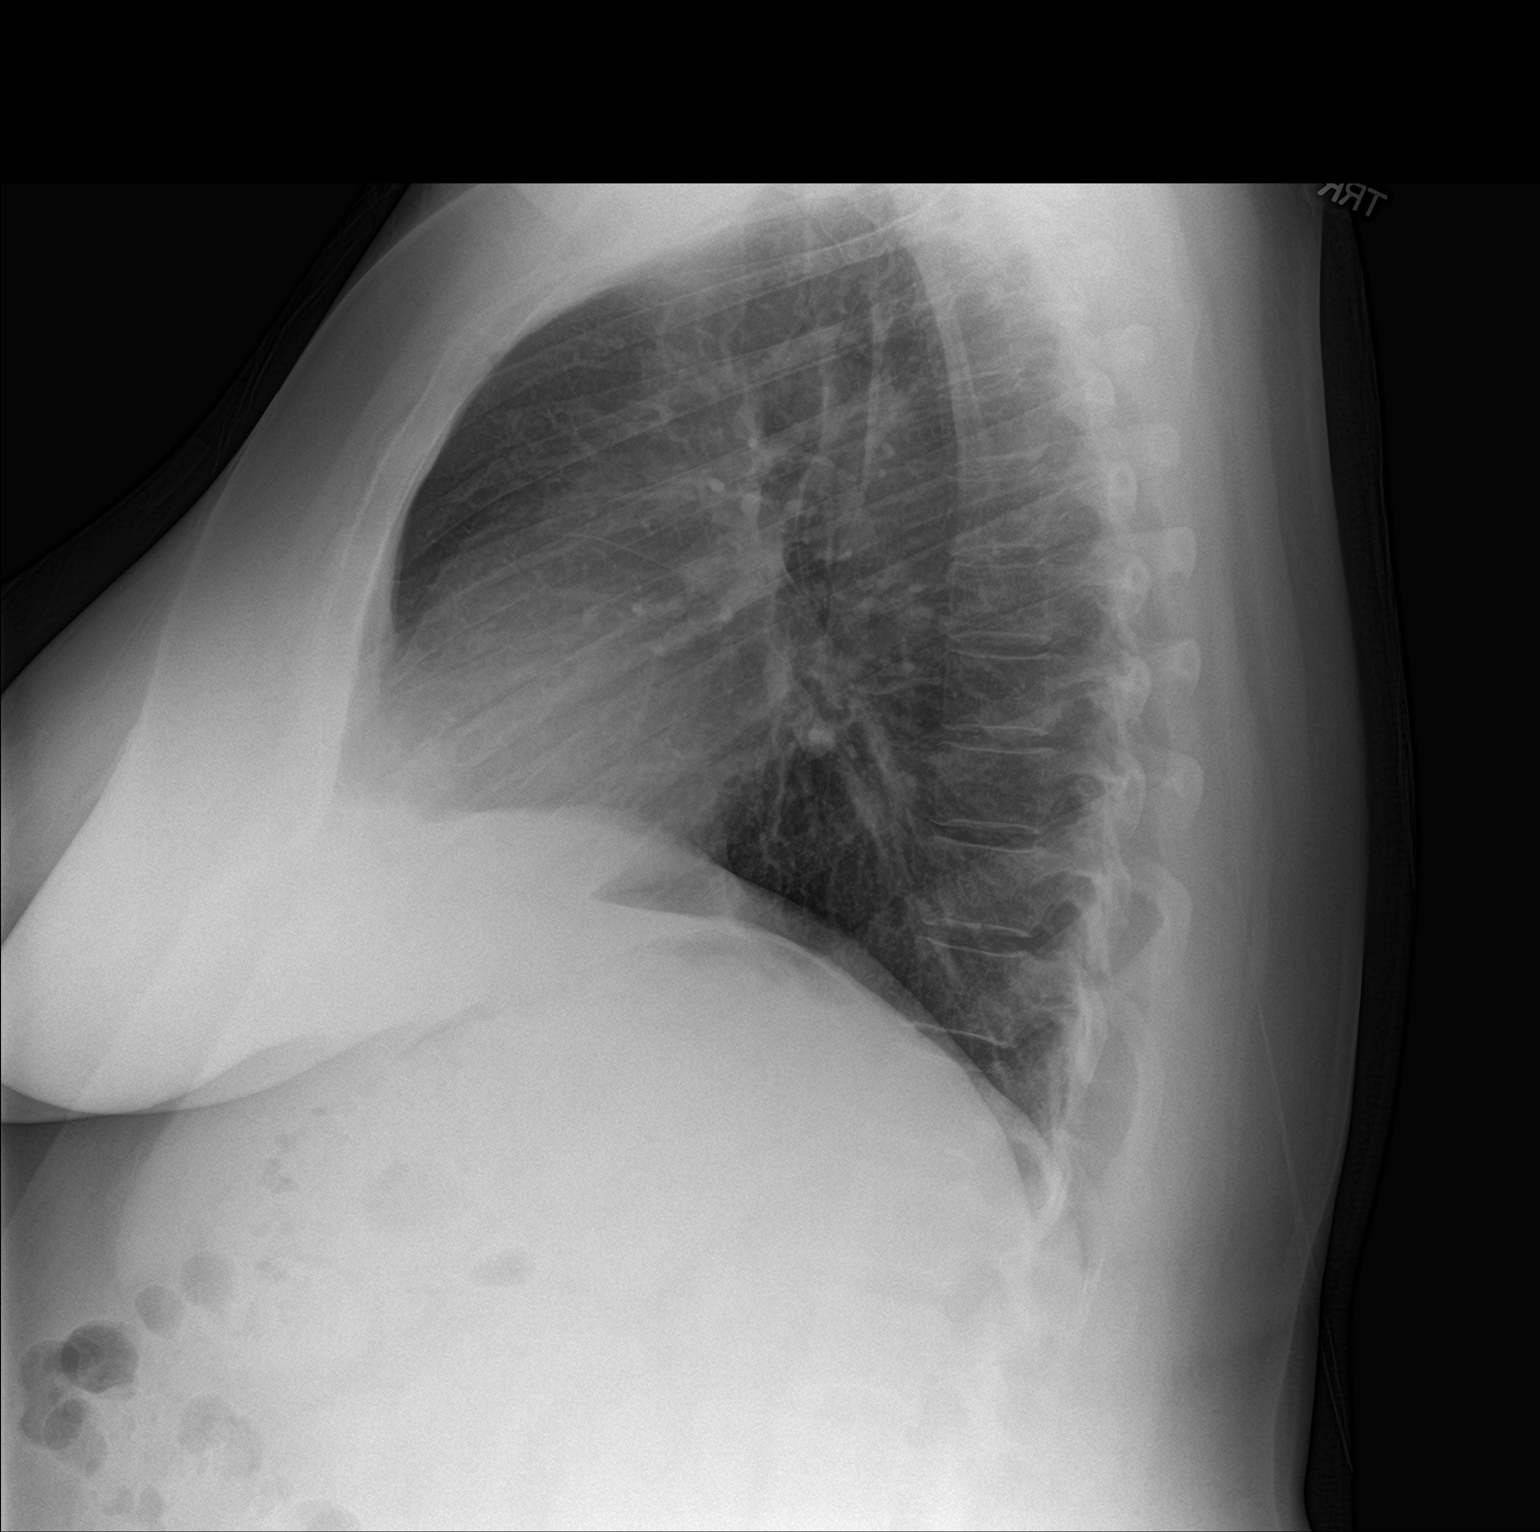

[2 of 2 positions shown; findings below may reference images not displayed]

FINDINGS: The lungs are clear without focal pneumonia, edema, pneumothorax or
pleural effusion. The cardiopericardial silhouette is within normal
limits for size. The visualized bony structures of the thorax are
intact.
IMPRESSION: No active cardiopulmonary disease.

## 2018-10-24 DIAGNOSIS — J0100 Acute maxillary sinusitis, unspecified: Secondary | ICD-10-CM | POA: Diagnosis not present

## 2020-10-31 ENCOUNTER — Other Ambulatory Visit: Payer: Self-pay

## 2020-10-31 ENCOUNTER — Emergency Department: Payer: 59

## 2020-10-31 ENCOUNTER — Emergency Department
Admission: EM | Admit: 2020-10-31 | Discharge: 2020-10-31 | Disposition: A | Discharge status: Home / Self Care | Payer: 59 | Attending: Emergency Medicine | Admitting: Emergency Medicine

## 2020-10-31 DIAGNOSIS — R0789 Other chest pain: Secondary | ICD-10-CM | POA: Diagnosis not present

## 2020-10-31 DIAGNOSIS — R079 Chest pain, unspecified: Secondary | ICD-10-CM | POA: Diagnosis present

## 2020-10-31 LAB — CBC
HCT: 41.5 % (ref 36.0–46.0)
Hemoglobin: 13.2 g/dL (ref 12.0–15.0)
MCH: 29.3 pg (ref 26.0–34.0)
MCHC: 31.8 g/dL (ref 30.0–36.0)
MCV: 92.2 fL (ref 80.0–100.0)
Platelets: 255 10*3/uL (ref 150–400)
RBC: 4.5 MIL/uL (ref 3.87–5.11)
RDW: 14.2 % (ref 11.5–15.5)
WBC: 8.2 10*3/uL (ref 4.0–10.5)
nRBC: 0 % (ref 0.0–0.2)

## 2020-10-31 LAB — BASIC METABOLIC PANEL
Anion gap: 8 (ref 5–15)
BUN: 9 mg/dL (ref 6–20)
CO2: 24 mmol/L (ref 22–32)
Calcium: 9.1 mg/dL (ref 8.9–10.3)
Chloride: 108 mmol/L (ref 98–111)
Creatinine, Ser: 0.63 mg/dL (ref 0.44–1.00)
GFR, Estimated: >60 mL/min (ref >60)
Glucose, Bld: 100 mg/dL — ABNORMAL HIGH (ref 70–99)
Potassium: 4 mmol/L (ref 3.5–5.1)
Sodium: 140 mmol/L (ref 135–145)

## 2020-10-31 LAB — TROPONIN I (HIGH SENSITIVITY): Troponin I (High Sensitivity): <2 ng/L (ref <18)

## 2020-10-31 NOTE — ED Triage Notes (Signed)
Pt to ER via POV with c/o L sided, non-radiating chest pain that is tight in nature. Reports standing in line and feeling weak like she was going to pass out. Denies SHOB/ denies hx of the same.

## 2020-10-31 NOTE — ED Provider Notes (Signed)
[facility] Emergency Department Provider Note   ____________________________________________    I have reviewed the triage vital signs and the nursing notes.   HISTORY  Chief Complaint Chest Pain     HPI Janet Harmon is a 50 y.o. female who presents after an episode of chest pain.  Patient reports that approximately 6:05 AM this morning she felt pain in her left lateral superior chest with some radiation into her shoulder.  She reports that pain has all but resolved at this time.  She denies shortness of breath or pleurisy.  At the time she felt somewhat anxious and dizzy, no syncopal episode.  No calf pain or swelling, no history of DVT.  Is currently going through divorce and does report increased stress.  No fevers chills or cough  History reviewed. No pertinent past medical history.  There are no problems to display for this patient.   Past Surgical History:  Procedure Laterality Date  . ABDOMINAL SURGERY      Prior to Admission medications   Not on File     Allergies Patient has no allergy information on record.  No family history on file.  Social History Social History   Tobacco Use  . Smoking status: Never Smoker  . Smokeless tobacco: Never Used  Vaping Use  . Vaping Use: Every day  Substance Use Topics  . Alcohol use: Yes  . Drug use: Never    Review of Systems  Constitutional: No fever/chills Eyes: No visual changes.  ENT: No sore throat. Cardiovascular: As above Respiratory: Denies shortness of breath. Gastrointestinal: Denies abdominal Genitourinary: Negative for dysuria. Musculoskeletal: No back pain. Skin: Negative for rash. Neurological: Negative for headaches or weakness   ____________________________________________   PHYSICAL EXAM:  VITAL SIGNS: ED Triage Vitals [10/31/20 1239]  Enc Vitals Group     BP (!) 149/93     Pulse Rate 84     Resp 16     Temp 98.4 F (36.9 C)     Temp Source Oral      SpO2 98 %     Weight 87.1 kg (192 lb)     Height 1.575 m (5\' 2" )     Head Circumference      Peak Flow      Pain Score 8     Pain Loc      Pain Edu?      Excl. in [place]?     Constitutional: Alert and oriented. No acute distress. Pleasant and interactive  Nose: No congestion/rhinnorhea. Mouth/Throat: Mucous membranes are moist.   Neck:  Painless ROM Cardiovascular: Normal rate, regular rhythm. Grossly normal heart sounds.  Good peripheral circulation. Respiratory: Normal respiratory effort.  No retractions. Lungs CTAB. Gastrointestinal: Soft and nontender. No distention.  No CVA tenderness.  Musculoskeletal: No lower extremity tenderness nor edema.  Warm and well perfused Neurologic:  Normal speech and language. No gross focal neurologic deficits are appreciated.  Skin:  Skin is warm, dry and intact. No rash noted. Psychiatric: Mood and affect are normal. Speech and behavior are normal.  ____________________________________________   LABS (all labs ordered are listed, but only abnormal results are displayed)  Labs Reviewed  BASIC METABOLIC PANEL - Abnormal; Notable for the following components:      Result Value   Glucose, Bld 100 (*)    All other components within normal limits  CBC  TROPONIN I (HIGH SENSITIVITY)   ____________________________________________  EKG  ED ECG REPORT I, Lavonia Drafts, the attending  physician, personally viewed and interpreted this ECG.  Date: 10/31/2020  Rhythm: normal sinus rhythm QRS Axis: normal Intervals: normal ST/T Wave abnormalities: normal Narrative Interpretation: no evidence of acute ischemia  ____________________________________________  RADIOLOGY  Chest x-ray viewed by me, normal study ____________________________________________   PROCEDURES  Procedure(s) performed: No  Procedures   Critical Care performed: No ____________________________________________   INITIAL IMPRESSION / ASSESSMENT AND PLAN / ED  COURSE  Pertinent labs & imaging results that were available during my care of the patient were reviewed by me and considered in my medical decision making (see chart for details).  Patient presents with chest pain as described above, resolved at this time.  EKG is quite reassuring, low risk for ACS  High-sensitivity troponin is normal.  Chest x-ray without evidence of fluid, pneumonia, pneumothorax  No back pain or calf pain, no pleurisy.  Patient feels well now.  Will refer to cardiology for further work-up, strict return precautions discussed, counseled patient to stop vaping.  Patient agrees with plan will return as needed    ____________________________________________   FINAL CLINICAL IMPRESSION(S) / ED DIAGNOSES  Final diagnoses:  Atypical chest pain        Note:  This document was prepared using Dragon voice recognition software and may include unintentional dictation errors.   Lavonia Drafts, MD 10/31/20 1400

## 2020-11-03 ENCOUNTER — Encounter: Payer: Self-pay | Admitting: Advanced Practice Midwife

## 2021-09-18 ENCOUNTER — Emergency Department: Payer: 59

## 2021-09-18 ENCOUNTER — Observation Stay: Payer: 59 | Admitting: Anesthesiology

## 2021-09-18 ENCOUNTER — Inpatient Hospital Stay
Admission: EM | Admit: 2021-09-18 | Discharge: 2021-09-20 | DRG: 854 | Disposition: A | Discharge status: Home Health | Payer: 59 | Attending: Internal Medicine | Admitting: Internal Medicine

## 2021-09-18 ENCOUNTER — Other Ambulatory Visit: Payer: Self-pay

## 2021-09-18 ENCOUNTER — Encounter
Admission: EM | Disposition: A | Discharge status: Home Health | Payer: Self-pay | Source: Home / Self Care | Attending: Internal Medicine

## 2021-09-18 DIAGNOSIS — K760 Fatty (change of) liver, not elsewhere classified: Secondary | ICD-10-CM | POA: Diagnosis present

## 2021-09-18 DIAGNOSIS — N201 Calculus of ureter: Secondary | ICD-10-CM | POA: Diagnosis not present

## 2021-09-18 DIAGNOSIS — A4151 Sepsis due to Escherichia coli [E. coli]: Secondary | ICD-10-CM | POA: Diagnosis not present

## 2021-09-18 DIAGNOSIS — K5900 Constipation, unspecified: Secondary | ICD-10-CM | POA: Diagnosis present

## 2021-09-18 DIAGNOSIS — Z79899 Other long term (current) drug therapy: Secondary | ICD-10-CM

## 2021-09-18 DIAGNOSIS — E538 Deficiency of other specified B group vitamins: Secondary | ICD-10-CM | POA: Diagnosis present

## 2021-09-18 DIAGNOSIS — N111 Chronic obstructive pyelonephritis: Secondary | ICD-10-CM | POA: Diagnosis present

## 2021-09-18 DIAGNOSIS — E66812 Obesity, class 2: Secondary | ICD-10-CM | POA: Diagnosis present

## 2021-09-18 DIAGNOSIS — N1330 Unspecified hydronephrosis: Secondary | ICD-10-CM | POA: Diagnosis not present

## 2021-09-18 DIAGNOSIS — E785 Hyperlipidemia, unspecified: Secondary | ICD-10-CM | POA: Diagnosis present

## 2021-09-18 DIAGNOSIS — N136 Pyonephrosis: Secondary | ICD-10-CM | POA: Diagnosis present

## 2021-09-18 DIAGNOSIS — N10 Acute tubulo-interstitial nephritis: Secondary | ICD-10-CM

## 2021-09-18 DIAGNOSIS — N23 Unspecified renal colic: Secondary | ICD-10-CM

## 2021-09-18 DIAGNOSIS — R7303 Prediabetes: Secondary | ICD-10-CM | POA: Diagnosis present

## 2021-09-18 DIAGNOSIS — N390 Urinary tract infection, site not specified: Secondary | ICD-10-CM | POA: Diagnosis not present

## 2021-09-18 DIAGNOSIS — Z6832 Body mass index (BMI) 32.0-32.9, adult: Secondary | ICD-10-CM

## 2021-09-18 DIAGNOSIS — Z87891 Personal history of nicotine dependence: Secondary | ICD-10-CM

## 2021-09-18 DIAGNOSIS — Z20822 Contact with and (suspected) exposure to covid-19: Secondary | ICD-10-CM | POA: Diagnosis present

## 2021-09-18 DIAGNOSIS — F411 Generalized anxiety disorder: Secondary | ICD-10-CM | POA: Diagnosis present

## 2021-09-18 DIAGNOSIS — A419 Sepsis, unspecified organism: Secondary | ICD-10-CM | POA: Diagnosis present

## 2021-09-18 DIAGNOSIS — E669 Obesity, unspecified: Secondary | ICD-10-CM | POA: Diagnosis present

## 2021-09-18 DIAGNOSIS — D72829 Elevated white blood cell count, unspecified: Secondary | ICD-10-CM | POA: Diagnosis present

## 2021-09-18 DIAGNOSIS — N12 Tubulo-interstitial nephritis, not specified as acute or chronic: Secondary | ICD-10-CM | POA: Diagnosis present

## 2021-09-18 DIAGNOSIS — E559 Vitamin D deficiency, unspecified: Secondary | ICD-10-CM | POA: Diagnosis present

## 2021-09-18 DIAGNOSIS — R3915 Urgency of urination: Secondary | ICD-10-CM | POA: Diagnosis present

## 2021-09-18 HISTORY — PX: CYSTOSCOPY W/ RETROGRADES: SHX1426

## 2021-09-18 HISTORY — PX: CYSTOSCOPY WITH STENT PLACEMENT: SHX5790

## 2021-09-18 LAB — CBC WITH DIFFERENTIAL/PLATELET
Abs Immature Granulocytes: 0.14 10*3/uL — ABNORMAL HIGH (ref 0.00–0.07)
Basophils Absolute: 0.1 10*3/uL (ref 0.0–0.1)
Basophils Relative: 0 %
Eosinophils Absolute: 0 10*3/uL (ref 0.0–0.5)
Eosinophils Relative: 0 %
HCT: 36.2 % (ref 36.0–46.0)
Hemoglobin: 11.5 g/dL — ABNORMAL LOW (ref 12.0–15.0)
Immature Granulocytes: 1 %
Lymphocytes Relative: 4 %
Lymphs Abs: 0.8 10*3/uL (ref 0.7–4.0)
MCH: 29.3 pg (ref 26.0–34.0)
MCHC: 31.8 g/dL (ref 30.0–36.0)
MCV: 92.1 fL (ref 80.0–100.0)
Monocytes Absolute: 1.4 10*3/uL — ABNORMAL HIGH (ref 0.1–1.0)
Monocytes Relative: 6 %
Neutro Abs: 20.3 10*3/uL — ABNORMAL HIGH (ref 1.7–7.7)
Neutrophils Relative %: 89 %
Platelets: 318 10*3/uL (ref 150–400)
RBC: 3.93 MIL/uL (ref 3.87–5.11)
RDW: 13.8 % (ref 11.5–15.5)
WBC: 22.8 10*3/uL — ABNORMAL HIGH (ref 4.0–10.5)
nRBC: 0 % (ref 0.0–0.2)

## 2021-09-18 LAB — URINALYSIS, ROUTINE W REFLEX MICROSCOPIC
Bilirubin Urine: NEGATIVE
Glucose, UA: NEGATIVE mg/dL
Ketones, ur: NEGATIVE mg/dL
Nitrite: POSITIVE — AB
Protein, ur: 100 mg/dL — AB
RBC / HPF: >50 RBC/hpf — ABNORMAL HIGH (ref 0–5)
Specific Gravity, Urine: 1.009 (ref 1.005–1.030)
WBC, UA: >50 WBC/hpf — ABNORMAL HIGH (ref 0–5)
pH: 6 (ref 5.0–8.0)

## 2021-09-18 LAB — COMPREHENSIVE METABOLIC PANEL
ALT: 18 U/L (ref 0–44)
AST: 28 U/L (ref 15–41)
Albumin: 3.8 g/dL (ref 3.5–5.0)
Alkaline Phosphatase: 80 U/L (ref 38–126)
Anion gap: 13 (ref 5–15)
BUN: 12 mg/dL (ref 6–20)
CO2: 20 mmol/L — ABNORMAL LOW (ref 22–32)
Calcium: 8.6 mg/dL — ABNORMAL LOW (ref 8.9–10.3)
Chloride: 104 mmol/L (ref 98–111)
Creatinine, Ser: 0.91 mg/dL (ref 0.44–1.00)
GFR, Estimated: >60 mL/min (ref >60)
Glucose, Bld: 118 mg/dL — ABNORMAL HIGH (ref 70–99)
Potassium: 4.3 mmol/L (ref 3.5–5.1)
Sodium: 137 mmol/L (ref 135–145)
Total Bilirubin: 1.8 mg/dL — ABNORMAL HIGH (ref 0.3–1.2)
Total Protein: 7.8 g/dL (ref 6.5–8.1)

## 2021-09-18 LAB — PREGNANCY, URINE: Preg Test, Ur: NEGATIVE

## 2021-09-18 LAB — RESP PANEL BY RT-PCR (FLU A&B, COVID) ARPGX2
Influenza A by PCR: NEGATIVE
Influenza B by PCR: NEGATIVE
SARS Coronavirus 2 by RT PCR: NEGATIVE

## 2021-09-18 LAB — LACTIC ACID, PLASMA
Lactic Acid, Venous: 1.3 mmol/L (ref 0.5–1.9)
Lactic Acid, Venous: 2.8 mmol/L (ref 0.5–1.9)

## 2021-09-18 SURGERY — CYSTOSCOPY, WITH STENT INSERTION
Anesthesia: General | Laterality: Right

## 2021-09-18 MED ORDER — ONDANSETRON HCL 4 MG PO TABS: 4.0000 mg | ORAL_TABLET | Freq: Four times a day (QID) | ORAL | Status: DC | PRN

## 2021-09-18 MED ORDER — VANCOMYCIN HCL 1750 MG/350ML IV SOLN: 1750.0000 mg | INTRAVENOUS | Status: DC

## 2021-09-18 MED ORDER — LACTATED RINGERS IV BOLUS (SEPSIS)
1000.0000 mL | Freq: Once | INTRAVENOUS | Status: AC
  Administered 2021-09-18: 1000 mL via INTRAVENOUS

## 2021-09-18 MED ORDER — FENTANYL CITRATE (PF) 100 MCG/2ML IJ SOLN
INTRAMUSCULAR | Status: DC | PRN
Start: 2021-09-18 — End: 2021-09-18
  Administered 2021-09-18 (×2): 50 ug via INTRAVENOUS

## 2021-09-18 MED ORDER — SUCCINYLCHOLINE CHLORIDE 200 MG/10ML IV SOSY
PREFILLED_SYRINGE | INTRAVENOUS | Status: DC | PRN
  Administered 2021-09-18: 100 mg via INTRAVENOUS

## 2021-09-18 MED ORDER — FENTANYL CITRATE (PF) 100 MCG/2ML IJ SOLN: 25.0000 ug | INTRAMUSCULAR | Status: DC | PRN

## 2021-09-18 MED ORDER — ACETAMINOPHEN 325 MG PO TABS
650.0000 mg | ORAL_TABLET | Freq: Once | ORAL | Status: AC
  Administered 2021-09-18: 650 mg via ORAL
  Filled 2021-09-18: qty 2

## 2021-09-18 MED ORDER — MIDAZOLAM HCL 2 MG/2ML IJ SOLN
INTRAMUSCULAR | Status: AC
  Filled 2021-09-18: qty 2

## 2021-09-18 MED ORDER — ACETAMINOPHEN 650 MG RE SUPP: 650.0000 mg | Freq: Four times a day (QID) | RECTAL | Status: DC | PRN

## 2021-09-18 MED ORDER — SODIUM CHLORIDE 0.9 % IR SOLN
Status: DC | PRN
  Administered 2021-09-18: 3000 mL

## 2021-09-18 MED ORDER — DEXAMETHASONE SODIUM PHOSPHATE 10 MG/ML IJ SOLN
INTRAMUSCULAR | Status: DC | PRN
  Administered 2021-09-18: 10 mg via INTRAVENOUS

## 2021-09-18 MED ORDER — OXYCODONE HCL 5 MG PO TABS: 5.0000 mg | ORAL_TABLET | Freq: Once | ORAL | Status: DC | PRN

## 2021-09-18 MED ORDER — SODIUM CHLORIDE 0.9 % IV SOLN
2.0000 g | INTRAVENOUS | Status: AC
  Administered 2021-09-19: 2 g via INTRAVENOUS
  Filled 2021-09-18: qty 2

## 2021-09-18 MED ORDER — SODIUM CHLORIDE 0.9 % IV SOLN: 2.0000 g | INTRAVENOUS | Status: DC

## 2021-09-18 MED ORDER — KETOROLAC TROMETHAMINE 30 MG/ML IJ SOLN
INTRAMUSCULAR | Status: DC | PRN
Start: 2021-09-18 — End: 2021-09-18
  Administered 2021-09-18: 30 mg via INTRAVENOUS

## 2021-09-18 MED ORDER — PAROXETINE HCL 20 MG PO TABS
20.0000 mg | ORAL_TABLET | Freq: Every day | ORAL | Status: DC
  Administered 2021-09-19 – 2021-09-20 (×2): 20 mg via ORAL
  Filled 2021-09-18 (×2): qty 1

## 2021-09-18 MED ORDER — LACTATED RINGERS IV BOLUS (SEPSIS)
1000.0000 mL | Freq: Once | INTRAVENOUS | Status: AC
  Administered 2021-09-19: 1000 mL via INTRAVENOUS

## 2021-09-18 MED ORDER — LIDOCAINE HCL (CARDIAC) PF 100 MG/5ML IV SOSY
PREFILLED_SYRINGE | INTRAVENOUS | Status: DC | PRN
  Administered 2021-09-18: 100 mg via INTRAVENOUS

## 2021-09-18 MED ORDER — KETOROLAC TROMETHAMINE 30 MG/ML IJ SOLN
INTRAMUSCULAR | Status: AC
  Filled 2021-09-18: qty 1

## 2021-09-18 MED ORDER — MORPHINE SULFATE (PF) 2 MG/ML IV SOLN
0.5000 mg | INTRAVENOUS | Status: DC | PRN
  Administered 2021-09-18 – 2021-09-19 (×2): 0.5 mg via INTRAVENOUS
  Filled 2021-09-18 (×2): qty 1

## 2021-09-18 MED ORDER — VITAMIN D (ERGOCALCIFEROL) 1.25 MG (50000 UNIT) PO CAPS
50000.0000 [IU] | ORAL_CAPSULE | ORAL | Status: DC
  Administered 2021-09-19: 50000 [IU] via ORAL
  Filled 2021-09-18: qty 1

## 2021-09-18 MED ORDER — FENTANYL CITRATE (PF) 100 MCG/2ML IJ SOLN
INTRAMUSCULAR | Status: AC
  Filled 2021-09-18: qty 2

## 2021-09-18 MED ORDER — ONDANSETRON HCL 4 MG/2ML IJ SOLN: 4.0000 mg | Freq: Four times a day (QID) | INTRAMUSCULAR | Status: DC | PRN

## 2021-09-18 MED ORDER — ONDANSETRON HCL 4 MG/2ML IJ SOLN
INTRAMUSCULAR | Status: DC | PRN
  Administered 2021-09-18: 4 mg via INTRAVENOUS

## 2021-09-18 MED ORDER — ONDANSETRON HCL 4 MG/2ML IJ SOLN: 4.0000 mg | Freq: Once | INTRAMUSCULAR | Status: DC | PRN

## 2021-09-18 MED ORDER — LACTATED RINGERS IV SOLN: INTRAVENOUS | Status: DC

## 2021-09-18 MED ORDER — SODIUM CHLORIDE 0.9 % IV SOLN
2.0000 g | Freq: Once | INTRAVENOUS | Status: AC
  Administered 2021-09-18: 2 g via INTRAVENOUS
  Filled 2021-09-18: qty 2

## 2021-09-18 MED ORDER — PROPOFOL 10 MG/ML IV BOLUS
INTRAVENOUS | Status: AC
  Filled 2021-09-18: qty 20

## 2021-09-18 MED ORDER — ACETAMINOPHEN 10 MG/ML IV SOLN
INTRAVENOUS | Status: AC
  Filled 2021-09-18: qty 100

## 2021-09-18 MED ORDER — VANCOMYCIN HCL IN DEXTROSE 1-5 GM/200ML-% IV SOLN
1000.0000 mg | Freq: Once | INTRAVENOUS | Status: AC
  Administered 2021-09-18: 1000 mg via INTRAVENOUS

## 2021-09-18 MED ORDER — ENOXAPARIN SODIUM 40 MG/0.4ML IJ SOSY
40.0000 mg | PREFILLED_SYRINGE | INTRAMUSCULAR | Status: DC
  Administered 2021-09-19: 40 mg via SUBCUTANEOUS
  Filled 2021-09-18: qty 0.4

## 2021-09-18 MED ORDER — MIDAZOLAM HCL 2 MG/2ML IJ SOLN
INTRAMUSCULAR | Status: DC | PRN
  Administered 2021-09-18: 2 mg via INTRAVENOUS

## 2021-09-18 MED ORDER — OXYCODONE HCL 5 MG/5ML PO SOLN: 5.0000 mg | Freq: Once | ORAL | Status: DC | PRN

## 2021-09-18 MED ORDER — VITAMIN B-12 1000 MCG PO TABS
1000.0000 ug | ORAL_TABLET | Freq: Every day | ORAL | Status: DC
  Administered 2021-09-19 – 2021-09-20 (×2): 1000 ug via ORAL
  Filled 2021-09-18 (×2): qty 1

## 2021-09-18 MED ORDER — METRONIDAZOLE 500 MG/100ML IV SOLN
500.0000 mg | Freq: Once | INTRAVENOUS | Status: DC
  Filled 2021-09-18: qty 100

## 2021-09-18 MED ORDER — PROPOFOL 10 MG/ML IV BOLUS
INTRAVENOUS | Status: DC | PRN
  Administered 2021-09-18: 150 mg via INTRAVENOUS

## 2021-09-18 MED ORDER — VANCOMYCIN HCL IN DEXTROSE 1-5 GM/200ML-% IV SOLN
1000.0000 mg | Freq: Once | INTRAVENOUS | Status: AC
  Administered 2021-09-18: 1000 mg via INTRAVENOUS
  Filled 2021-09-18: qty 200

## 2021-09-18 MED ORDER — ACETAMINOPHEN 325 MG PO TABS
650.0000 mg | ORAL_TABLET | Freq: Four times a day (QID) | ORAL | Status: DC | PRN
  Administered 2021-09-18: 650 mg via ORAL
  Filled 2021-09-18: qty 2

## 2021-09-18 SURGICAL SUPPLY — 24 items
BAG DRAIN CYSTO-URO LG1000N (MISCELLANEOUS) ×3 IMPLANT
BRUSH SCRUB EZ 1% IODOPHOR (MISCELLANEOUS) IMPLANT
CATH FOL 2WAY LX 16X30 (CATHETERS) ×1 IMPLANT
CATH URETL OPEN 5X70 (CATHETERS) ×3 IMPLANT
GAUZE 4X4 16PLY ~~LOC~~+RFID DBL (SPONGE) ×6 IMPLANT
GLOVE SURG UNDER POLY LF SZ7.5 (GLOVE) ×3 IMPLANT
GOWN STRL REUS W/ TWL LRG LVL3 (GOWN DISPOSABLE) ×2 IMPLANT
GOWN STRL REUS W/ TWL XL LVL3 (GOWN DISPOSABLE) ×2 IMPLANT
GOWN STRL REUS W/TWL LRG LVL3 (GOWN DISPOSABLE) ×1
GOWN STRL REUS W/TWL XL LVL3 (GOWN DISPOSABLE) ×1
GUIDEWIRE STR DUAL SENSOR (WIRE) ×3 IMPLANT
IV NS IRRIG 3000ML ARTHROMATIC (IV SOLUTION) ×3 IMPLANT
KIT TURNOVER CYSTO (KITS) ×3 IMPLANT
MANIFOLD NEPTUNE II (INSTRUMENTS) ×3 IMPLANT
PACK CYSTO AR (MISCELLANEOUS) ×3 IMPLANT
SET CYSTO W/LG BORE CLAMP LF (SET/KITS/TRAYS/PACK) ×3 IMPLANT
SHEATH URETERAL 12FRX35CM (MISCELLANEOUS) ×1 IMPLANT
STENT URET 6FRX24 CONTOUR (STENTS) IMPLANT
STENT URET 6FRX26 CONTOUR (STENTS) ×1 IMPLANT
SURGILUBE 2OZ TUBE FLIPTOP (MISCELLANEOUS) ×3 IMPLANT
SYR TOOMEY IRRIG 70ML (MISCELLANEOUS)
SYRINGE TOOMEY IRRIG 70ML (MISCELLANEOUS) IMPLANT
WATER STERILE IRR 1000ML POUR (IV SOLUTION) ×3 IMPLANT
WATER STERILE IRR 500ML POUR (IV SOLUTION) ×3 IMPLANT

## 2021-09-18 NOTE — ED Notes (Signed)
Unable to start IV at this time, two attempts by this RN and Arboriculturist. Order placed for IV team.

## 2021-09-18 NOTE — Transfer of Care (Signed)
Immediate Anesthesia Transfer of Care Note  Patient: Janet Harmon  Procedure(s) Performed: CYSTOSCOPY WITH STENT PLACEMENT (Right) CYSTOSCOPY WITH RETROGRADE PYELOGRAM  Patient Location: PACU  Anesthesia Type:General  Level of Consciousness: drowsy  Airway & Oxygen Therapy: Patient Spontanous Breathing  Post-op Assessment: Report given to RN  Post vital signs: stable  Last Vitals:  Vitals Value Taken Time  BP 105/56 09/18/21 1907  Temp 36.9 C 09/18/21 1907  Pulse 104 09/18/21 1911  Resp 34 09/18/21 1911  SpO2 93 % 09/18/21 1911  Vitals shown include unvalidated device data.  Last Pain:  Vitals:   09/18/21 1907  TempSrc:   PainSc: Asleep         Complications: No notable events documented.

## 2021-09-18 NOTE — Assessment & Plan Note (Signed)
-   Resumed home paroxetine 20 mg daily

## 2021-09-18 NOTE — Anesthesia Procedure Notes (Signed)
Procedure Name: Intubation Date/Time: 09/18/2021 6:43 PM Performed by: Debe Coder, CRNA Pre-anesthesia Checklist: Patient identified, Emergency Drugs available, Suction available and Patient being monitored Patient Re-evaluated:Patient Re-evaluated prior to induction Oxygen Delivery Method: Circle system utilized Preoxygenation: Pre-oxygenation with 100% oxygen Induction Type: IV induction Ventilation: Mask ventilation without difficulty Laryngoscope Size: McGraph and 3 Grade View: Grade I Tube type: Oral Tube size: 6.5 mm Number of attempts: 1 Airway Equipment and Method: Stylet and Oral airway Placement Confirmation: ETT inserted through vocal cords under direct vision, positive ETCO2 and breath sounds checked- equal and bilateral Secured at: 21 cm Tube secured with: Tape Dental Injury: Teeth and Oropharynx as per pre-operative assessment

## 2021-09-18 NOTE — Anesthesia Postprocedure Evaluation (Signed)
Anesthesia Post Note  Patient: Janet Harmon  Procedure(s) Performed: CYSTOSCOPY WITH STENT PLACEMENT (Right) CYSTOSCOPY WITH RETROGRADE PYELOGRAM  Patient location during evaluation: PACU Anesthesia Type: General Level of consciousness: awake and alert Pain management: pain level controlled Vital Signs Assessment: post-procedure vital signs reviewed and stable Respiratory status: spontaneous breathing, nonlabored ventilation, respiratory function stable and patient connected to nasal cannula oxygen Cardiovascular status: blood pressure returned to baseline and stable Postop Assessment: no apparent nausea or vomiting Anesthetic complications: no   No notable events documented.   Last Vitals:  Vitals:   09/18/21 1907 09/18/21 1915  BP: (!) 105/56 96/61  Pulse: (!) 104 (!) 105  Resp: (!) 26 (!) 33  Temp: 36.9 C   SpO2: 95% 97%    Last Pain:  Vitals:   09/18/21 1915  TempSrc:   PainSc: 0-No pain                 Arita Miss

## 2021-09-18 NOTE — Anesthesia Preprocedure Evaluation (Signed)
Anesthesia Evaluation  Patient identified by MRN, date of birth, ID band Patient awake  General Assessment Comment:  Acute ureteral obstruction, back pain, dry heaving for days. Last drank a cup of water 1 hour prior, but Dr Diamantina [place] urologist is declaring this case emergent.  Reviewed: Allergy & Precautions, NPO status , Patient's Chart, lab work & pertinent test results  History of Anesthesia Complications Negative for: history of anesthetic complications  Airway Mallampati: III  TM Distance: >3 FB Neck ROM: Full    Dental no notable dental hx. (+) Teeth Intact   Pulmonary neg pulmonary ROS, neg sleep apnea, neg COPD, Patient abstained from smoking.Not current smoker,    Pulmonary exam normal breath sounds clear to auscultation       Cardiovascular Exercise Tolerance: Good METS(-) hypertension(-) CAD and (-) Past MI negative cardio ROS  (-) dysrhythmias  Rhythm:Regular Rate:Normal - Systolic murmurs    Neuro/Psych PSYCHIATRIC DISORDERS Anxiety negative neurological ROS     GI/Hepatic neg GERD  ,(+)     (-) substance abuse  ,   Endo/Other  neg diabetes  Renal/GU Renal disease     Musculoskeletal   Abdominal   Peds  Hematology   Anesthesia Other Findings Past Medical History: No date: Kidney stone  Reproductive/Obstetrics                             Anesthesia Physical Anesthesia Plan  ASA: 2 and emergent  Anesthesia Plan: General   Post-op Pain Management: Toradol IV (intra-op)   Induction: Intravenous and Rapid sequence  PONV Risk Score and Plan: 4 or greater and Ondansetron, Dexamethasone and Midazolam  Airway Management Planned: Oral ETT  Additional Equipment: None  Intra-op Plan:   Post-operative Plan: Extubation in OR  Informed Consent: I have reviewed the patients History and Physical, chart, labs and discussed the procedure including the risks, benefits and  alternatives for the proposed anesthesia with the patient or authorized representative who has indicated his/her understanding and acceptance.     Dental advisory given  Plan Discussed with: CRNA and Surgeon  Anesthesia Plan Comments: (Discussed risks of anesthesia with patient, including PONV, sore throat, lip/dental/eye damage. Rare risks discussed as well, such as cardiorespiratory and neurological sequelae, and allergic reactions. Discussed the role of CRNA in patient's perioperative care. Patient understands.)        Anesthesia Quick Evaluation

## 2021-09-18 NOTE — Assessment & Plan Note (Signed)
-   Follow-up with PCP outpatient for healthy weight loss via diet and exercise as tolerated

## 2021-09-18 NOTE — H&P (Signed)
No History and Physical   [name] TOI:[id number] DOB: 1971/02/04 DOA: 09/18/2021  PCP: Wayland Denis, PA-C  Patient coming from:   I have personally briefly reviewed patient's old medical records in [place].  Chief Concern: Back pain  HPI: Janet Harmon is a 51 year old with history of generalized anxiety disorder, prediabetes, fatty infiltration of the liver, hyperlipidemia, vitamin D deficiency, vitamin B12 deficiency, who presents to emergency department for chief concerns of mid to lower right-sided back pain.  Initial vitals in the emergency department show temperature 99.1, respiration rate of 18, heart rate of 105, blood pressure 110/67, SPO2 of 92% on room air.  Serum sodium in the emergency department showed serum sodium was 137, potassium 4.3, chloride 104, bicarb 20, nonfasting blood glucose 118, BUN of 12, serum creatinine of 0.91, GFR of greater than 60.  WBC was significant elevated at 22.8, hemoglobin 11.5, platelets of 318.  Lactic acid was initially 1.3 and about 1 hour and 45 minutes later it was collected and found to be 2.8.  CT renal study ordered: Read as obstructing 7 x 4 mm stone at the right ureterovesical junction resulting in moderate right-sided hydroureteronephrosis.  ED treatment: Acetaminophen 650 mg p.o., cefepime 2 g IV, LR a total of 3 L bolus ordered, LR 150 mL/h IVF ordered, vancomycin IV.  Patient was taken to the OR and received cystoscopy with right ureteral stent placement on day of admission. ____  At bedside, she is able to tell me her name, age, current calendar year.   She endorsed back pain on the right side, mid to lower back pain, sharp, 9/10, comes and goes. Now her pain level is a 1-2/10. This started about three days ago. The endorsed T max of 103.0 and she took tylenol which temporarily relieved fever and it would come back again.  She endorses chills. She states it is similar to her prior kidney stones 5 years  ago, however it was much worse during this admission.   She denies dysuria, hematuria, diarrhea. She endorsed nausea and vomiting (once last evening) and then dry heaving. Her vomitus last evening was yellow bile and denies blood and black vomitus. She denies chest pain, shortness of breath. She endorsed associated right sided lower abdominal pain.   She endorsed constipation, last bowel movement was 09/17/21.   Social history: She lives at home with her three kids. She is a former tobacco user, quitting 10 years ago. At the most, she smoked less than 1/2 ppd. She denies etoh and recreational drug use. She works as a bar tender.   Vaccination history: She is vaccinated for covid 19, one J&J. She is not vaccinated for influenza.   ROS: Constitutional: no weight change, + fever ENT/Mouth: no sore throat, no rhinorrhea Eyes: no eye pain, no vision changes Cardiovascular: no chest pain, no dyspnea,  no edema, no palpitations Respiratory: + cough, no sputum, no wheezing Gastrointestinal: + nausea, + vomiting, no diarrhea, + constipation Genitourinary: no urinary incontinence, no dysuria, no hematuria Musculoskeletal: no arthralgias, no myalgias Skin: no skin lesions, no pruritus, Neuro: + weakness, no loss of consciousness, no syncope Psych: no anxiety, no depression, + decrease appetite Heme/Lymph: no bruising, no bleeding  ED Course: Discussed with emergency medicine provider, patient requiring hospitalization for 2 concerns of sepsis secondary to pyelonephritis.  Assessment/Plan  Principal Problem:   Obstructive pyelonephritis Active Problems:   Obesity, Class II, BMI 35-39.9   Fatty infiltration of liver   Generalized anxiety disorder  Prediabetes   Leukocytosis   Sepsis secondary to UTI [facility])    Genitourinary * Obstructive pyelonephritis Assessment & Plan - Status post cefepime, metronidazole, vancomycin - Status post stent placement per urology service - Ordered  ceftriaxone 2 g IV - Urine culture ordered, blood culture ordered in the ED and is in process  Other Sepsis secondary to UTI [facility]) Assessment & Plan - Patient met sepsis criteria with increased heart rate, leukocytosis, source, and increased lactic acid - Complicated by urolithiasis - LR for total of 3 L has been ordered in the emergency department and per [facility] only 1 LR liter was given and patient was taken to the OR for cystoscopy - Continue LR 150 mL/h - We will follow-up on additional lactic acid and if normalized, patient will not require full sepsis bolus  Leukocytosis Assessment & Plan - Secondary to pyelonephritis, treat as above  Generalized anxiety disorder Assessment & Plan - Resumed home paroxetine 20 mg daily  Obesity, Class II, BMI 35-39.9 Assessment & Plan - Follow-up with PCP outpatient for healthy weight loss via diet and exercise as tolerated  Chart reviewed.   DVT prophylaxis: Enoxaparin Code Status: Full code Diet: Regular diet Family Communication: No Disposition Plan: Pending clinical course, anticipate less than 2 night stay Consults called: Urology Admission status: MedSurg, observation, no telemetry  Past Medical History:  Diagnosis Date   Kidney stone    Past Surgical History:  Procedure Laterality Date   ABDOMINAL SURGERY     CESAREAN SECTION     EXTRACORPOREAL SHOCK WAVE LITHOTRIPSY Left 05/20/2016   Procedure: EXTRACORPOREAL SHOCK WAVE LITHOTRIPSY (ESWL);  Surgeon: Royston Cowper, MD;  Location: [facility] ORS;  Service: Urology;  Laterality: Left;   TUBAL LIGATION     WISDOM TOOTH EXTRACTION     Social History:  reports that she has never smoked. She has never used smokeless tobacco. She reports current alcohol use. She reports that she does not use drugs.  No Known Allergies Family History  Problem Relation Age of Onset   Prostate cancer Father 38   Breast cancer Neg Hx    Family history: Family history reviewed and not  pertinent  Prior to Admission medications   Medication Sig Start Date End Date Taking? Authorizing Provider  PARoxetine (PAXIL) 20 MG tablet Take 20 mg by mouth daily.   Yes [provider]  vitamin B-12 (CYANOCOBALAMIN) 1000 MCG tablet Take 1,000 mcg by mouth daily.   Yes [provider]  Vitamin D, Ergocalciferol, (DRISDOL) 50000 units CAPS capsule Take 50,000 Units by mouth once a week.   Yes [provider]   Physical Exam: Vitals:   09/18/21 1951 09/18/21 2000 09/18/21 2020 09/18/21 2122  BP:  98/60 (!) 100/59 99/61  Pulse: 98 94 92 83  Resp: (!) 28 (!) _0 Temp:   98.9 F (37.2 C) 98.6 F (37 C)  TempSrc:    Oral  SpO2: 96% 96% 94% 97%  Weight:      Height:       Constitutional: appears age-appropriate, NAD, calm, comfortable Eyes: PERRL, lids and conjunctivae normal ENMT: Mucous membranes are moist. Posterior pharynx clear of any exudate or lesions. Age-appropriate dentition. Hearing appropriate Neck: normal, supple, no masses, no thyromegaly Respiratory: clear to auscultation bilaterally, no wheezing, no crackles. Normal respiratory effort. No accessory muscle use.  Cardiovascular: Regular rate and rhythm, no murmurs / rubs / gallops. No extremity edema. 2+ pedal pulses. No carotid bruits.  Abdomen: Obese abdomen, no  tenderness, no masses palpated, no hepatosplenomegaly. Bowel sounds positive.  Musculoskeletal: no clubbing / cyanosis. No joint deformity upper and lower extremities. Good ROM, no contractures, no atrophy. Normal muscle tone.  Skin: no rashes, lesions, ulcers. No induration Neurologic: Sensation intact. Strength 5/5 in all 4.  Psychiatric: Normal judgment and insight. Alert and oriented x 3. Normal mood.   EKG: Not indicated  Chest x-ray on Admission: Not indicated  DG OR UROLOGY CYSTO IMAGE ([place])  Result Date: 09/18/2021 There is no interpretation for this exam.  This order is for images obtained during a surgical  procedure.  Please See "Surgeries" Tab for more information regarding the procedure.   CT Renal Stone Study  Result Date: 09/18/2021 CLINICAL DATA:  Flank pain, nausea EXAM: CT ABDOMEN AND PELVIS WITHOUT CONTRAST TECHNIQUE: Multidetector CT imaging of the abdomen and pelvis was performed following the standard protocol without IV contrast. RADIATION DOSE REDUCTION: This exam was performed according to the departmental dose-optimization program which includes automated exposure control, adjustment of the mA and/or kV according to patient size and/or use of iterative reconstruction technique. COMPARISON:  01/24/2017 FINDINGS: Lower chest: Included lung bases are clear.  Heart size is normal. Hepatobiliary: Unremarkable unenhanced appearance of the liver. No focal liver lesion identified. Gallbladder within normal limits. No hyperdense gallstone. No biliary dilatation. Pancreas: Unremarkable. No pancreatic ductal dilatation or surrounding inflammatory changes. Spleen: Normal in size without focal abnormality. Adrenals/Urinary Tract: Unremarkable adrenal glands. Obstructing 7 x 4 mm stone located at the right ureterovesical junction resulting in moderate right-sided hydroureteronephrosis. There is small-moderate volume of right perinephric fluid suggesting caliceal rupture. No additional stones within the right kidney. The left kidney is unremarkable. No left-sided renal stone or hydronephrosis. Urinary bladder is unremarkable. Stomach/Bowel: Stomach is within normal limits. Appendix appears normal (series 2, image 59). No evidence of bowel wall thickening, distention, or inflammatory changes. Vascular/Lymphatic: No significant vascular findings are present. No enlarged abdominal or pelvic lymph nodes. Reproductive: Uterus and bilateral adnexa are unremarkable. Other: No organized abdominopelvic fluid collection. No pneumoperitoneum. No abdominal wall hernia. Musculoskeletal: No acute or significant osseous  findings. Assimilation joint on the right at the lumbosacral junction. IMPRESSION: Obstructing 7 x 4 mm stone at the right ureterovesical junction resulting in moderate right-sided hydroureteronephrosis. Small-moderate volume of right perinephric fluid suggesting caliceal rupture. Electronically Signed   By: Davina Poke D.O.   On: 09/18/2021 17:36    Labs on Admission: I have personally reviewed following labs  CBC: Recent Labs  Lab 09/18/21 1540  WBC 22.8*  NEUTROABS 20.3*  HGB 11.5*  HCT 36.2  MCV 92.1  PLT 700   Basic Metabolic Panel: Recent Labs  Lab 09/18/21 1709  NA 137  K 4.3  CL 104  CO2 20*  GLUCOSE 118*  BUN 12  CREATININE 0.91  CALCIUM 8.6*   GFR: Estimated Creatinine Clearance: 79.8 mL/min (by C-G formula based on SCr of 0.91 mg/dL).  Liver Function Tests: Recent Labs  Lab 09/18/21 1709  AST 28  ALT 18  ALKPHOS 80  BILITOT 1.8*  PROT 7.8  ALBUMIN 3.8   Urine analysis:    Component Value Date/Time   COLORURINE YELLOW (A) 09/18/2021 1709   APPEARANCEUR CLOUDY (A) 09/18/2021 1709   APPEARANCEUR Hazy 06/24/2014 0245   LABSPEC 1.009 09/18/2021 1709   LABSPEC 1.024 06/24/2014 0245   PHURINE 6.0 09/18/2021 1709   GLUCOSEU NEGATIVE 09/18/2021 1709   GLUCOSEU Negative 06/24/2014 0245   HGBUR LARGE (A) 09/18/2021 1709   BILIRUBINUR  NEGATIVE 09/18/2021 1709   BILIRUBINUR Negative 07/05/2018 1434   BILIRUBINUR Negative 06/24/2014 0245   KETONESUR NEGATIVE 09/18/2021 1709   PROTEINUR 100 (A) 09/18/2021 1709   UROBILINOGEN 0.2 07/05/2018 1434   NITRITE POSITIVE (A) 09/18/2021 1709   LEUKOCYTESUR LARGE (A) 09/18/2021 1709   LEUKOCYTESUR Negative 06/24/2014 0245   Dr. Tobie Poet [facility]  If 7PM-7AM, please contact overnight-coverage provider If 7AM-7PM, please contact day coverage provider [email or web address]  09/18/2021, 11:47 PM

## 2021-09-18 NOTE — Consult Note (Signed)
Pharmacy Antibiotic Note  Janet Harmon is a 51 y.o. female admitted on 09/18/2021 with sepsis.  Pharmacy has been consulted for vancomycin dosing.  Plan: Vancomycin 2000 mg IV loading dose, followed by 1750 mg IV q24h Goal AUC 400-550  Est AUC: 486.4 Est Cmax: 37.7 Est Cmin: 10.4 Calculated with SCr 0.91, Vd 0.72  Pt also ordered ceftriaxone 2 g IV q24h  Monitor clinical picture, renal function, and vancomycin levels at steady state F/U C&S, abx deescalation / LOT   Height: 5\' 4"  (162.6 cm) Weight: 85.3 kg (188 lb) IBW/kg (Calculated) : 54.7  Temp (24hrs), Avg:99 F (37.2 C), Min:98.5 F (36.9 C), Max:99.6 F (37.6 C)  Recent Labs  Lab 09/18/21 1540 09/18/21 1709  WBC 22.8*  --   CREATININE  --  0.91  LATICACIDVEN 1.3 2.8*    Estimated Creatinine Clearance: 79.8 mL/min (by C-G formula based on SCr of 0.91 mg/dL).    No Known Allergies  Antimicrobials this admission: 2/10 cefepime x 1  2/10 ceftriaxone >>  2/10 vancomycin >>   Dose adjustments this admission:   Microbiology results: 2/10 BCx: pending   Thank you for allowing pharmacy to be a part of this patients care.  Darnelle Bos, PharmD 09/18/2021 8:50 PM

## 2021-09-18 NOTE — ED Notes (Signed)
Pt had labs and urine processed at [facility] today.

## 2021-09-18 NOTE — ED Notes (Signed)
Patient transported to CT 

## 2021-09-18 NOTE — Op Note (Addendum)
Date of procedure: 09/18/21  Preoperative diagnosis:  Right ureteral stone UTI, sepsis from urinary source  Postoperative diagnosis:  Same  Procedure: Cystoscopy, right ureteral stent placement  Surgeon: Nickolas Madrid, MD  Anesthesia: General  Complications: None  Intraoperative findings:  Normal cystoscopy, uncomplicated right ureteral stent placement with purulent drainage  EBL: None  Specimens: None  Drains: Right 6 French by 26 cm ureteral stone  Indication: Janet Harmon is a 51 y.o. patient with right-sided flank pain and nausea vomiting with CT showing a 7 mm right distal ureteral stone, and clinical and laboratory signs consistent with UTI and sepsis from urinary source.  After reviewing the management options for treatment, they elected to proceed with the above surgical procedure(s). We have discussed the potential benefits and risks of the procedure, side effects of the proposed treatment, the likelihood of the patient achieving the goals of the procedure, and any potential problems that might occur during the procedure or recuperation. Informed consent has been obtained.  Description of procedure:  The patient was taken to the operating room and general anesthesia was induced. SCDs were placed for DVT prophylaxis. The patient was placed in the dorsal lithotomy position, prepped and draped in the usual sterile fashion, and preoperative antibiotics(cefepime and vancomycin in the ED) were administered. A preoperative time-out was performed.   A 21 French rigid cystoscope was used to intubate the urethra and thorough cystoscopy was performed.  The bladder was grossly normal with no suspicious lesions, and the ureteral orifices were orthotopic bilaterally.  A sensor wire was advanced into the right ureteral orifice and passed easily up to the kidney under fluoroscopic vision.  A 6 French by 26 cm ureteral stent was advanced over the wire, with an excellent curl over the  projected location of the kidney, as well as under direct vision the bladder.  Purulent urine drained through the sideport of the stent.  A 16 French Foley was placed to maximize drainage in the postoperative period, and 10 mL placed in the balloon.  Disposition: Stable to PACU  Plan: Admit to hospitalist for broad-spectrum antibiotics and resuscitation Foley can be removed when clinically improved and afebrile 24 hours Urology will coordinate outpatient right ureteroscopy and laser lithotripsy in 2 to 3 weeks  Nickolas Madrid, MD

## 2021-09-18 NOTE — Assessment & Plan Note (Signed)
-   Secondary to pyelonephritis, treat as above

## 2021-09-18 NOTE — Consult Note (Signed)
PHARMACY -  BRIEF ANTIBIOTIC NOTE   Pharmacy has received consult(s) for Vancomycin and Cefepime from an ED provider.  The patient's profile has been reviewed for ht/wt/allergies/indication/available labs.    One time order(s) placed for Vancomycin 1g IV  and Cefepime 2g IV x 1 dose each.  Further antibiotics/pharmacy consults should be ordered by admitting physician if indicated.                       Thank you, Pearla Dubonnet 09/18/2021  4:56 PM

## 2021-09-18 NOTE — ED Provider Notes (Signed)
[facility] Provider Note    Event Date/Time   First MD Initiated Contact with Patient 09/18/21 1644     (approximate)   History   Nausea   HPI  Janet Harmon is a 51 y.o. female complaining of right-sided flank pain radiating from around the back into the groin.  She is not having any difficulty urinating.  Is been going on for about 3 days.  She has had temperatures up to 102 103 at home with nausea and vomiting and dry heaves.  Urinalysis there showed multiple RBCs and WBCs in the urine.  A high white blood count but normal liver functions and BUN/creatinine etc.   Physical Exam   Triage Vital Signs: ED Triage Vitals  Enc Vitals Group     BP 09/18/21 1446 110/67     Pulse Rate 09/18/21 1446 (!) 105     Resp 09/18/21 1446 18     Temp 09/18/21 1446 99.1 F (37.3 C)     Temp Source 09/18/21 1446 Oral     SpO2 09/18/21 1446 92 %     Weight 09/18/21 1447 188 lb (85.3 kg)     Height 09/18/21 1447 5\' 4"  (1.626 m)     Head Circumference --      Peak Flow --      Pain Score 09/18/21 1447 8     Pain Loc --      Pain Edu? --      Excl. in [place]? --     Most recent vital signs: Vitals:   09/18/21 1938 09/18/21 1945  BP: (!) 94/58 96/61  Pulse: 99 100  Resp: 18 (!) 28  Temp:  99.6 F (37.6 C)  SpO2: 100% 95%     General: Awake, alert and having shaking chills CV:  Good peripheral perfusion.  Resp:  Normal effort.  Lungs are clear Abd:  No distention soft bowel sounds are positive there is no tenderness in the abdomen on palpation or in the CVA areas on percussion Extremities not painful   ED Results / Procedures / Treatments   Labs (all labs ordered are listed, but only abnormal results are displayed) Labs Reviewed  CBC WITH DIFFERENTIAL/PLATELET - Abnormal; Notable for the following components:      Result Value   WBC 22.8 (*)    Hemoglobin 11.5 (*)    Neutro Abs 20.3 (*)    Monocytes Absolute 1.4 (*)    Abs Immature Granulocytes  0.14 (*)    All other components within normal limits  LACTIC ACID, PLASMA - Abnormal; Notable for the following components:   Lactic Acid, Venous 2.8 (*)    All other components within normal limits  COMPREHENSIVE METABOLIC PANEL - Abnormal; Notable for the following components:   CO2 20 (*)    Glucose, Bld 118 (*)    Calcium 8.6 (*)    Total Bilirubin 1.8 (*)    All other components within normal limits  URINALYSIS, ROUTINE W REFLEX MICROSCOPIC - Abnormal; Notable for the following components:   Color, Urine YELLOW (*)    APPearance CLOUDY (*)    Hgb urine dipstick LARGE (*)    Protein, ur 100 (*)    Nitrite POSITIVE (*)    Leukocytes,Ua LARGE (*)    RBC / HPF >50 (*)    WBC, UA >50 (*)    Bacteria, UA MANY (*)    All other components within normal limits  CULTURE, BLOOD (SINGLE)  MRSA NEXT GEN BY  PCR, NASAL  RESP PANEL BY RT-PCR (FLU A&B, COVID) ARPGX2  LACTIC ACID, PLASMA  PREGNANCY, URINE  HIV ANTIBODY (ROUTINE TESTING W REFLEX)  BASIC METABOLIC PANEL  CBC WITH DIFFERENTIAL/PLATELET     EKG     RADIOLOGY  CT shows obstructing stone at the right UVJ 7 x 4 mm.  I reviewed the films.  I agree completely.  There is a lot of inflammation around the kidney as well.  PROCEDURES:  Critical Care performed: Critical care time 20 minutes.  This includes evaluating the patient for sepsis checking all her studies and discussing her with the hospitalist and the urologist.  Also I reviewed some of her old records.  Procedures   MEDICATIONS ORDERED IN ED: Medications  lactated ringers infusion ( Intravenous Anesthesia Volume Adjustment 09/18/21 1911)  metroNIDAZOLE (FLAGYL) IVPB 500 mg ( Intravenous MAR Hold 09/18/21 1837)  lactated ringers bolus 1,000 mL ( Intravenous MAR Hold 09/18/21 1837)    And  lactated ringers bolus 1,000 mL ( Intravenous MAR Hold 09/18/21 1837)    And  lactated ringers bolus 1,000 mL ( Intravenous MAR Hold 09/18/21 1837)  acetaminophen (TYLENOL)  tablet 650 mg ( Oral MAR Hold 09/18/21 1837)    Or  acetaminophen (TYLENOL) suppository 650 mg ( Rectal MAR Hold 09/18/21 1837)  ondansetron (ZOFRAN) tablet 4 mg ( Oral MAR Hold 09/18/21 1837)    Or  ondansetron (ZOFRAN) injection 4 mg ( Intravenous MAR Hold 09/18/21 1837)  enoxaparin (LOVENOX) injection 40 mg ( Subcutaneous Automatically Held 09/27/21 2200)  cefTRIAXone (ROCEPHIN) 2 g in sodium chloride 0.9 % 100 mL IVPB ( Intravenous Automatically Held 09/19/21 1000)  oxyCODONE (Oxy IR/ROXICODONE) immediate release tablet 5 mg (has no administration in time range)    Or  oxyCODONE (ROXICODONE) 5 MG/5ML solution 5 mg (has no administration in time range)  fentaNYL (SUBLIMAZE) injection 25-50 mcg (has no administration in time range)  ondansetron (ZOFRAN) injection 4 mg (has no administration in time range)  ceFEPIme (MAXIPIME) 2 g in sodium chloride 0.9 % 100 mL IVPB (2 g Intravenous New Bag/Given 09/18/21 1803)  vancomycin (VANCOCIN) IVPB 1000 mg/200 mL premix (1,000 mg Intravenous Given 09/18/21 1847)  acetaminophen (TYLENOL) tablet 650 mg (650 mg Oral Given 09/18/21 1813)     IMPRESSION / MDM / ASSESSMENT AND PLAN / ED COURSE  I reviewed the triage vital signs and the nursing notes. Patient with sepsis.  There is apparently an obstructing stone.  There is no other sign of reason for sepsis.  I will page Dr. Glori Luis.  The stone is almost into the bladder.  Hopefully we can get it to fall into the bladder without too much difficulty.  This will relieve a lot of the problem.  Patient's lactic acid is not elevated but her white count is markedly elevated at 22.8 with a high white count.  Urine has not come back yet and I imagine that this will show red and white cells as I did in the [place] clinic.  We will have to get a urine culture of course. ----------------------------------------- 5:50 PM on 09/18/2021 -----------------------------------------  Discussed the patient with Dr. Glori Luis he  will come in to admit her.  I also discussed her with the hospitalist who will take care of her after Dr. Caprice Beaver dose.  Interestingly just as the patient was going up to the OR her pain improved and her chills went away.  It is possible that she has now passed the stone.  Dr. Emogene Morgan ski will  check on this and see.  We will still have to treat her for sepsis.  Which should be much easier once the stone is out of its obstructing position.   FINAL CLINICAL IMPRESSION(S) / ED DIAGNOSES   Final diagnoses:  Sepsis, due to unspecified organism, unspecified whether acute organ dysfunction present [facility])  Acute pyelonephritis  Renal colic on right side     Rx / DC Orders   ED Discharge Orders     None        Note:  This document was prepared using Dragon voice recognition software and may include unintentional dictation errors.   Nena Polio, MD 09/18/21 [phone]

## 2021-09-18 NOTE — ED Triage Notes (Signed)
Pt here for N/V from [facility]. Pt was given tylenol and zofran which has helped. Pt in NAD in triage. Pt states lower back pain x3 days.

## 2021-09-18 NOTE — Consult Note (Signed)
Urology Consult   I have been asked to see the patient by Dr. Cinda Quest, for evaluation and management of right ureteral stone, hydronephrosis, UTI, sepsis from urinary source.  Chief Complaint: Right flank pain, fever/chills  HPI:  Janet Harmon is a 51 y.o. year old female who reports about 1 week of moderate to severe right-sided flank pain radiating from around the back into the right groin.  She has also had some nausea, vomiting, and fever of 103 at home.  There are no aggravating or alleviating factors.  She has not had anything to eat or drink today because of the nausea.  Urinalysis suspicious for infection, lactate elevated at 2.8, leukocytosis to 22 K.  CT shows a 7 mm right distal ureteral stone with upstream hydronephrosis.  She has a prior history of lithotripsy in 2017.  PMH: Past Medical History:  Diagnosis Date   Kidney stone     Surgical History: Past Surgical History:  Procedure Laterality Date   ABDOMINAL SURGERY     CESAREAN SECTION     EXTRACORPOREAL SHOCK WAVE LITHOTRIPSY Left 05/20/2016   Procedure: EXTRACORPOREAL SHOCK WAVE LITHOTRIPSY (ESWL);  Surgeon: Royston Cowper, MD;  Location: [facility] ORS;  Service: Urology;  Laterality: Left;   TUBAL LIGATION     WISDOM TOOTH EXTRACTION      Allergies: No Known Allergies  Family History: Family History  Problem Relation Age of Onset   Prostate cancer Father 74   Breast cancer Neg Hx     Social History:  reports that she has never smoked. She has never used smokeless tobacco. She reports current alcohol use. She reports that she does not use drugs.  ROS: Negative aside from those stated in the HPI.  Physical Exam: BP 126/80    Pulse (!) 109    Temp 99.1 F (37.3 C) (Oral)    Resp 18    Ht 5\' 4"  (1.626 m)    Wt 85.3 kg    SpO2 97%    BMI 32.27 kg/m    Constitutional:  Alert and oriented, No acute distress.  Appears uncomfortable Cardiovascular: Tachycardic, regular rhythm Respiratory: Clear to  auscultation bilaterally GI: Abdomen is soft, nontender, nondistended, no abdominal masses GU: Right CVA tenderness Lymph: No cervical or inguinal lymphadenopathy. Skin: No rashes, bruises or suspicious lesions. Neurologic: Grossly intact, no focal deficits, moving all 4 extremities. Psychiatric: Normal mood and affect.  Laboratory Data: Reviewed in epic, see HPI  Pertinent Imaging: I have personally reviewed the CT showing a 7 mm right distal ureteral stone with upstream hydronephrosis, no renal stones, moderate perinephric stranding.  Assessment & Plan:   51 year old female with 7 mm right distal ureteral stone with upstream hydronephrosis and perinephric stranding, clinical picture consistent with right ureteral stone with infected obstructed system, and sepsis from urinary source.  We discussed the need for drainage in the setting of an infected and obstructed system.  A ureteral stent is a small plastic tube that is placed cystoscopically with one end in the kidney and the other end in the bladder that allows the infection from the kidney to drain, and relieves pain from the obstructing stone.  We discussed the risks at length including bleeding, infection, sepsis, death, ureteral injury, and stent related symptoms including urgency/frequency/dysuria/flank pain/gross hematuria.  There is a low, but not 0, risk of inability to pass the ureteral stent alongside the stone from below which would require percutaneous nephrostomy tube by interventional radiology.  Finally, we discussed possible  prolonged hospitalization and recovery, possible temporary Foley catheter placement, and 10 to 14-day course of antibiotics.  We reviewed the need for a follow-up procedure for definitive management of their stone when the infection has been treated in 2 to 3 weeks with ureteroscopy/laser lithotripsy.   Recommendations: Agree with admission to hospitalist for antibiotics and resuscitation OR tonight for  cystoscopy and right ureteral stent placement Urology will coordinate outpatient ureteroscopy, laser lithotripsy in 2 to 3 weeks once infection treated  Billey Co, [place] [address], [place], [place] [address] [phone]

## 2021-09-18 NOTE — Consult Note (Signed)
CODE SEPSIS - PHARMACY COMMUNICATION  **Broad Spectrum Antibiotics should be administered within 1 hour of Sepsis diagnosis**  Time Code Sepsis Called/Page Received: 1648  Antibiotics Ordered: Vancomycin,Cefepime,flagyl  Time of 1st antibiotic administration: 1803  Additional action taken by pharmacy: Difficulty with starting IV on patient. Order was placed for IV team to assist.  If necessary, Name of Provider/Nurse Contacted: n/a    Pearla Dubonnet ,PharmD Clinical Pharmacist  09/18/2021  6:12 PM

## 2021-09-18 NOTE — Progress Notes (Signed)
[facility]link following sepsis protocol.  

## 2021-09-18 NOTE — ED Notes (Signed)
First Nurse Note:  Pt to ED from [facility] for UTI and elevated WBC. Pts WBC at walk in 23.6. Pt is currently in NAD.

## 2021-09-18 NOTE — Hospital Course (Signed)
Ms. Taryn Shellhammer is a 51 year old with history of generalized anxiety disorder, prediabetes, fatty infiltration of the liver, hyperlipidemia, vitamin D deficiency, vitamin B12 deficiency, who presents to emergency department for chief concerns of mid to lower right-sided back pain.  Initial vitals in the emergency department show temperature 99.1, respiration rate of 18, heart rate of 105, blood pressure 110/67, SPO2 of 92% on room air.  Serum sodium in the emergency department showed serum sodium was 137, potassium 4.3, chloride 104, bicarb 20, nonfasting blood glucose 118, BUN of 12, serum creatinine of 0.91, GFR of greater than 60.  WBC was significant elevated at 22.8, hemoglobin 11.5, platelets of 318.  Lactic acid was initially 1.3 and about 1 hour and 45 minutes later it was collected and found to be 2.8.  CT renal study ordered: Read as obstructing 7 x 4 mm stone at the right ureterovesical junction resulting in moderate right-sided hydroureteronephrosis.  ED treatment: Acetaminophen 650 mg p.o., cefepime 2 g IV, LR a total of 3 L bolus ordered, LR 150 mL/h IVF ordered, vancomycin IV.  Patient was taken to the OR and received cystoscopy with right ureteral stent placement on day of admission.

## 2021-09-18 NOTE — Sepsis Progress Note (Signed)
Notified provider via secure chat of need to order repeat lactic acid. And to clarify fluid boluses. Message sent to RN via secure chat

## 2021-09-18 NOTE — Assessment & Plan Note (Addendum)
-   Status post cefepime, metronidazole, vancomycin - Status post stent placement per urology service - Ordered ceftriaxone 2 g IV - Urine culture ordered, blood culture ordered in the ED and is in process

## 2021-09-18 NOTE — Assessment & Plan Note (Addendum)
-  Patient met sepsis criteria with increased heart rate, leukocytosis, source, and increased lactic acid - Complicated by urolithiasis - LR for total of 3 L has been ordered in the emergency department and per [facility] only 1 LR liter was given and patient was taken to the OR for cystoscopy - Continue LR 150 mL/h - We will follow-up on additional lactic acid and if normalized, patient will not require full sepsis bolus

## 2021-09-19 ENCOUNTER — Encounter: Payer: Self-pay | Admitting: Internal Medicine

## 2021-09-19 DIAGNOSIS — N390 Urinary tract infection, site not specified: Secondary | ICD-10-CM | POA: Diagnosis not present

## 2021-09-19 DIAGNOSIS — E785 Hyperlipidemia, unspecified: Secondary | ICD-10-CM | POA: Diagnosis present

## 2021-09-19 DIAGNOSIS — E559 Vitamin D deficiency, unspecified: Secondary | ICD-10-CM | POA: Diagnosis present

## 2021-09-19 DIAGNOSIS — Z20822 Contact with and (suspected) exposure to covid-19: Secondary | ICD-10-CM | POA: Diagnosis present

## 2021-09-19 DIAGNOSIS — Z87891 Personal history of nicotine dependence: Secondary | ICD-10-CM | POA: Diagnosis not present

## 2021-09-19 DIAGNOSIS — N1330 Unspecified hydronephrosis: Secondary | ICD-10-CM | POA: Diagnosis not present

## 2021-09-19 DIAGNOSIS — R3915 Urgency of urination: Secondary | ICD-10-CM | POA: Diagnosis present

## 2021-09-19 DIAGNOSIS — R7303 Prediabetes: Secondary | ICD-10-CM | POA: Diagnosis present

## 2021-09-19 DIAGNOSIS — F411 Generalized anxiety disorder: Secondary | ICD-10-CM | POA: Diagnosis present

## 2021-09-19 DIAGNOSIS — N136 Pyonephrosis: Secondary | ICD-10-CM | POA: Diagnosis present

## 2021-09-19 DIAGNOSIS — N12 Tubulo-interstitial nephritis, not specified as acute or chronic: Secondary | ICD-10-CM | POA: Diagnosis present

## 2021-09-19 DIAGNOSIS — K760 Fatty (change of) liver, not elsewhere classified: Secondary | ICD-10-CM | POA: Diagnosis present

## 2021-09-19 DIAGNOSIS — Z79899 Other long term (current) drug therapy: Secondary | ICD-10-CM | POA: Diagnosis not present

## 2021-09-19 DIAGNOSIS — E538 Deficiency of other specified B group vitamins: Secondary | ICD-10-CM | POA: Diagnosis present

## 2021-09-19 DIAGNOSIS — N111 Chronic obstructive pyelonephritis: Secondary | ICD-10-CM | POA: Diagnosis not present

## 2021-09-19 DIAGNOSIS — Z6832 Body mass index (BMI) 32.0-32.9, adult: Secondary | ICD-10-CM | POA: Diagnosis not present

## 2021-09-19 DIAGNOSIS — A4151 Sepsis due to Escherichia coli [E. coli]: Secondary | ICD-10-CM | POA: Diagnosis present

## 2021-09-19 DIAGNOSIS — N201 Calculus of ureter: Secondary | ICD-10-CM | POA: Diagnosis not present

## 2021-09-19 DIAGNOSIS — E669 Obesity, unspecified: Secondary | ICD-10-CM | POA: Diagnosis present

## 2021-09-19 DIAGNOSIS — K5900 Constipation, unspecified: Secondary | ICD-10-CM | POA: Diagnosis present

## 2021-09-19 LAB — LACTIC ACID, PLASMA: Lactic Acid, Venous: 1.6 mmol/L (ref 0.5–1.9)

## 2021-09-19 LAB — BLOOD CULTURE ID PANEL (REFLEXED) - BCID2

## 2021-09-19 LAB — CBC WITH DIFFERENTIAL/PLATELET
Abs Immature Granulocytes: 0.52 10*3/uL — ABNORMAL HIGH (ref 0.00–0.07)
Basophils Absolute: 0 10*3/uL (ref 0.0–0.1)
Basophils Relative: 0 %
Eosinophils Absolute: 0 10*3/uL (ref 0.0–0.5)
Eosinophils Relative: 0 %
HCT: 33.4 % — ABNORMAL LOW (ref 36.0–46.0)
Hemoglobin: 10.7 g/dL — ABNORMAL LOW (ref 12.0–15.0)
Immature Granulocytes: 3 %
Lymphocytes Relative: 3 %
Lymphs Abs: 0.5 10*3/uL — ABNORMAL LOW (ref 0.7–4.0)
MCH: 30.1 pg (ref 26.0–34.0)
MCHC: 32 g/dL (ref 30.0–36.0)
MCV: 94.1 fL (ref 80.0–100.0)
Monocytes Absolute: 0.6 10*3/uL (ref 0.1–1.0)
Monocytes Relative: 3 %
Neutro Abs: 16.9 10*3/uL — ABNORMAL HIGH (ref 1.7–7.7)
Neutrophils Relative %: 91 %
Platelets: 253 10*3/uL (ref 150–400)
RBC: 3.55 MIL/uL — ABNORMAL LOW (ref 3.87–5.11)
RDW: 13.9 % (ref 11.5–15.5)
WBC: 18.6 10*3/uL — ABNORMAL HIGH (ref 4.0–10.5)
nRBC: 0 % (ref 0.0–0.2)

## 2021-09-19 LAB — BASIC METABOLIC PANEL
Anion gap: 6 (ref 5–15)
BUN: 12 mg/dL (ref 6–20)
CO2: 26 mmol/L (ref 22–32)
Calcium: 8.5 mg/dL — ABNORMAL LOW (ref 8.9–10.3)
Chloride: 106 mmol/L (ref 98–111)
Creatinine, Ser: 0.8 mg/dL (ref 0.44–1.00)
GFR, Estimated: >60 mL/min (ref >60)
Glucose, Bld: 193 mg/dL — ABNORMAL HIGH (ref 70–99)
Potassium: 4 mmol/L (ref 3.5–5.1)
Sodium: 138 mmol/L (ref 135–145)

## 2021-09-19 LAB — HIV ANTIBODY (ROUTINE TESTING W REFLEX): HIV Screen 4th Generation wRfx: NONREACTIVE

## 2021-09-19 MED ORDER — MORPHINE SULFATE (PF) 2 MG/ML IV SOLN: 2.0000 mg | INTRAVENOUS | Status: DC | PRN

## 2021-09-19 MED ORDER — OXYCODONE HCL 5 MG PO TABS
5.0000 mg | ORAL_TABLET | ORAL | Status: DC | PRN
  Administered 2021-09-19 – 2021-09-20 (×3): 5 mg via ORAL
  Filled 2021-09-19 (×3): qty 1

## 2021-09-19 MED ORDER — VANCOMYCIN HCL 1500 MG/300ML IV SOLN: 1500.0000 mg | INTRAVENOUS | Status: DC

## 2021-09-19 MED ORDER — SENNOSIDES-DOCUSATE SODIUM 8.6-50 MG PO TABS
1.0000 | ORAL_TABLET | Freq: Two times a day (BID) | ORAL | Status: DC
  Administered 2021-09-19 – 2021-09-20 (×3): 1 via ORAL
  Filled 2021-09-19 (×3): qty 1

## 2021-09-19 MED ORDER — SODIUM CHLORIDE 0.9 % IV SOLN: INTRAVENOUS | Status: DC

## 2021-09-19 MED ORDER — SODIUM CHLORIDE 0.9 % IV SOLN
2.0000 g | INTRAVENOUS | Status: DC
  Administered 2021-09-20: 2 g via INTRAVENOUS
  Filled 2021-09-19: qty 2

## 2021-09-19 MED ORDER — BISACODYL 10 MG RE SUPP: 10.0000 mg | Freq: Every day | RECTAL | Status: DC | PRN

## 2021-09-19 MED ORDER — CHLORHEXIDINE GLUCONATE CLOTH 2 % EX PADS
6.0000 | MEDICATED_PAD | Freq: Every day | CUTANEOUS | Status: DC
  Administered 2021-09-19 – 2021-09-20 (×2): 6 via TOPICAL

## 2021-09-19 MED ORDER — KETOROLAC TROMETHAMINE 15 MG/ML IJ SOLN
15.0000 mg | Freq: Four times a day (QID) | INTRAMUSCULAR | Status: DC
  Administered 2021-09-19 – 2021-09-20 (×5): 15 mg via INTRAVENOUS
  Filled 2021-09-19 (×5): qty 1

## 2021-09-19 MED ORDER — POLYETHYLENE GLYCOL 3350 17 G PO PACK
17.0000 g | PACK | Freq: Every day | ORAL | Status: DC
  Administered 2021-09-19 – 2021-09-20 (×2): 17 g via ORAL
  Filled 2021-09-19 (×2): qty 1

## 2021-09-19 NOTE — Consult Note (Signed)
Pharmacy Antibiotic Note  Janet Harmon is a 51 y.o. female admitted on 09/18/2021 with sepsis.  Pharmacy has been consulted for vancomycin dosing.  Plan: Will adjust dose to Vancomycin 1750 mg IV q24h Goal AUC 400-550  Est AUC: 532.6 Est Cmax: 44.2 Est Cmin: 10.0 Calculated with SCr 0.8, Vd 0.5  Pt also ordered ceftriaxone 2 g IV q24h  Monitor clinical picture, renal function, and vancomycin levels at steady state F/U C&S, abx deescalation / LOT   Height: 5\' 4"  (162.6 cm) Weight: 85.3 kg (188 lb) IBW/kg (Calculated) : 54.7  Temp (24hrs), Avg:99 F (37.2 C), Min:98 F (36.7 C), Max:99.8 F (37.7 C)  Recent Labs  Lab 09/18/21 1540 09/18/21 1709 09/19/21 0121  WBC 22.8*  --  18.6*  CREATININE  --  0.91 0.80  LATICACIDVEN 1.3 2.8* 1.6     Estimated Creatinine Clearance: 90.8 mL/min (by C-G formula based on SCr of 0.8 mg/dL).    No Known Allergies  Antimicrobials this admission: 2/10 cefepime x 1  2/10 ceftriaxone >>  2/10 vancomycin >>   Dose adjustments this admission:   Microbiology results: 2/10 BCx: NGTD 2/10 Ucx: pending   Thank you for allowing pharmacy to be a part of this patients care.  Pearla Dubonnet, PharmD 09/19/2021 9:04 AM

## 2021-09-19 NOTE — Progress Notes (Signed)
PHARMACY - PHYSICIAN COMMUNICATION CRITICAL VALUE ALERT - BLOOD CULTURE IDENTIFICATION (BCID)  Janet Harmon is an 51 y.o. female who presented to [facility] on 09/18/2021 with a chief complaint of mid to lower right-sided back pain.  Assessment: 50YOF admitted with sepsis. Gram stain growing GNR 1/2 aerobic bottle. BCID detected E. Coli, no resistance.  Name of physician (or Provider) Contacted: Dr. Priscella Mann  Current antibiotics: ceftriaxone 2 grams every 24 hours, vancomycin 1750 mg every 24 hours and metronidazole   Changes to prescribed antibiotics recommended:  Continue ceftriaxone 2 grams every 24 hours Discontinue vancomycin and metronidazole  Recommendations accepted by provider   Wynelle Cleveland, PharmD Pharmacy Resident  09/19/2021 11:10 AM

## 2021-09-19 NOTE — Progress Notes (Signed)
PROGRESS NOTE    Janet Harmon  QJF:[id number] DOB: [birth year] DOA: 09/18/2021 PCP: Wayland Denis, PA-C    Brief Narrative:   51 year old with history of generalized anxiety disorder, prediabetes, fatty infiltration of the liver, hyperlipidemia, vitamin D deficiency, vitamin B12 deficiency, who presents to emergency department for chief concerns of mid to lower right-sided back pain.  Serum sodium in the emergency department showed serum sodium was 137, potassium 4.3, chloride 104, bicarb 20, nonfasting blood glucose 118, BUN of 12, serum creatinine of 0.91, GFR of greater than 60.   WBC was significant elevated at 22.8, hemoglobin 11.5, platelets of 318.   Lactic acid was initially 1.3 and about 1 hour and 45 minutes later it was collected and found to be 2.8.   CT renal study ordered: Read as obstructing 7 x 4 mm stone at the right ureterovesical junction resulting in moderate right-sided hydroureteronephrosis.  Urology consulted.  Patient taken urgently to operating room for a cystoscopy and right ureteral stent placement.  Doing well post procedurally.  Assessment & Plan:   Principal Problem:   Obstructive pyelonephritis Active Problems:   Obesity, Class II, BMI 35-39.9   Fatty infiltration of liver   Generalized anxiety disorder   Prediabetes   Leukocytosis   Sepsis secondary to UTI [facility])  Obstructive pyelonephritis/nephrolithiasis Patient status post cystoscopy with right ureteral stent placement Doing well postoperatively Care plan discussed with Dr. Diamantina Providence Plan: Continue ceftriaxone DC metronidazole and vancomycin DC Foley catheter IV fluids As needed pain control Monitor cultures Anticipate discharge 2/12 with prescriptions for antibiotics, Flomax, oxybutynin Urology to arrange outpatient follow-up for stone retrieval and stent removal  Sepsis secondary to urinary tract infection In setting of urolithiasis IV fluids Pain control IV  antibiotics  Constipation Multimodal bowel regimen  Generalized anxiety disorder PTA Paxil    DVT prophylaxis: SQ Lovenox Code Status: Full Family Communication: None today Disposition Plan: Status is: Inpatient Remains inpatient appropriate because: Obstructive stone and resultant pyelonephritis on IV antibiotics and IV pain control.  Anticipate discharge 2/12    Level of care: Med-Surg  Consultants:  Urology  Procedures:  Cystoscopy with stent placement 2/10  Antimicrobials: Ceftriaxone   Subjective: Seen and examined.  Resting comfortably in bed.  No visible distress.  Only complaint of constipation.  Pain well controlled  Objective: Vitals:   09/18/21 2020 09/18/21 2122 09/19/21 0456 09/19/21 0859  BP: (!) 100/59 99/61 113/77 136/70  Pulse: 92 83 66 80  Resp: 18 18 20 18   Temp: 98.9 F (37.2 C) 98.6 F (37 C) 98 F (36.7 C) 99.8 F (37.7 C)  TempSrc:  Oral Oral   SpO2: 94% 97% 94% 99%  Weight:      Height:        Intake/Output Summary (Last 24 hours) at 09/19/2021 1142 Last data filed at 09/19/2021 1017 Gross per 24 hour  Intake 5093.35 ml  Output 2100 ml  Net 2993.35 ml   Filed Weights   09/18/21 1447  Weight: 85.3 kg    Examination:  General exam: No acute distress Respiratory system: Clear to auscultation. Respiratory effort normal. Cardiovascular system: S1-S2, RRR, no murmurs, no pedal edema Gastrointestinal system: Soft, NT/ND, normal bowel sounds Central nervous system: Alert and oriented. No focal neurological deficits. Extremities: Symmetric 5 x 5 power. Skin: No rashes, lesions or ulcers Psychiatry: Judgement and insight appear normal. Mood & affect appropriate.     Data Reviewed: I have personally reviewed following labs and imaging studies  CBC: Recent Labs  Lab  09/18/21 1540 09/19/21 0121  WBC 22.8* 18.6*  NEUTROABS 20.3* 16.9*  HGB 11.5* 10.7*  HCT 36.2 33.4*  MCV 92.1 94.1  PLT 318 476   Basic Metabolic  Panel: Recent Labs  Lab 09/18/21 1709 09/19/21 0121  NA 137 138  K 4.3 4.0  CL 104 106  CO2 20* 26  GLUCOSE 118* 193*  BUN 12 12  CREATININE 0.91 0.80  CALCIUM 8.6* 8.5*   GFR: Estimated Creatinine Clearance: 90.8 mL/min (by C-G formula based on SCr of 0.8 mg/dL). Liver Function Tests: Recent Labs  Lab 09/18/21 1709  AST 28  ALT 18  ALKPHOS 80  BILITOT 1.8*  PROT 7.8  ALBUMIN 3.8   No results for input(s): LIPASE, AMYLASE in the last 168 hours. No results for input(s): AMMONIA in the last 168 hours. Coagulation Profile: No results for input(s): INR, PROTIME in the last 168 hours. Cardiac Enzymes: No results for input(s): CKTOTAL, CKMB, CKMBINDEX, TROPONINI in the last 168 hours. BNP (last 3 results) No results for input(s): PROBNP in the last 8760 hours. HbA1C: No results for input(s): HGBA1C in the last 72 hours. CBG: No results for input(s): GLUCAP in the last 168 hours. Lipid Profile: No results for input(s): CHOL, HDL, LDLCALC, TRIG, CHOLHDL, LDLDIRECT in the last 72 hours. Thyroid Function Tests: No results for input(s): TSH, T4TOTAL, FREET4, T3FREE, THYROIDAB in the last 72 hours. Anemia Panel: No results for input(s): VITAMINB12, FOLATE, FERRITIN, TIBC, IRON, RETICCTPCT in the last 72 hours. Sepsis Labs: Recent Labs  Lab 09/18/21 1540 09/18/21 1709 09/19/21 0121  LATICACIDVEN 1.3 2.8* 1.6    Recent Results (from the past 240 hour(s))  Culture, blood (single)     Status: None (Preliminary result)   Collection Time: 09/18/21  5:09 PM   Specimen: BLOOD  Result Value Ref Range Status   Specimen Description BLOOD RAC  Final   Special Requests BOTTLES DRAWN AEROBIC AND ANAEROBIC BCAV  Final   Culture  Setup Time   Final    Organism ID to follow GRAM NEGATIVE RODS AEROBIC BOTTLE ONLY CRITICAL RESULT CALLED TO, READ BACK BY AND VERIFIED WITH: CAROLYN CHILDS AT 1109 09/19/21.PMF Performed at [facility], [address] [place], [place]  [address]    Culture GRAM NEGATIVE RODS  Final   Report Status PENDING  Incomplete  Blood Culture ID Panel (Reflexed)     Status: Abnormal   Collection Time: 09/18/21  5:09 PM  Result Value Ref Range Status   Enterococcus faecalis NOT DETECTED NOT DETECTED Final   Enterococcus Faecium NOT DETECTED NOT DETECTED Final   Listeria monocytogenes NOT DETECTED NOT DETECTED Final   Staphylococcus species NOT DETECTED NOT DETECTED Final   Staphylococcus aureus (BCID) NOT DETECTED NOT DETECTED Final   Staphylococcus epidermidis NOT DETECTED NOT DETECTED Final   Staphylococcus lugdunensis NOT DETECTED NOT DETECTED Final   Streptococcus species NOT DETECTED NOT DETECTED Final   Streptococcus agalactiae NOT DETECTED NOT DETECTED Final   Streptococcus pneumoniae NOT DETECTED NOT DETECTED Final   Streptococcus pyogenes NOT DETECTED NOT DETECTED Final   A.calcoaceticus-baumannii NOT DETECTED NOT DETECTED Final   Bacteroides fragilis NOT DETECTED NOT DETECTED Final   Enterobacterales DETECTED (A) NOT DETECTED Final    Comment: Enterobacterales represent a large order of gram negative bacteria, not a single organism. CRITICAL RESULT CALLED TO, READ BACK BY AND VERIFIED WITH: CAROLYN CHILDS AT 1109 09/19/21.PMF    Enterobacter cloacae complex NOT DETECTED NOT DETECTED Final   Escherichia coli DETECTED (A) NOT  DETECTED Final    Comment: CRITICAL RESULT CALLED TO, READ BACK BY AND VERIFIED WITH: CAROLYN CHILDS AT 1109 09/19/21.PMF    Klebsiella aerogenes NOT DETECTED NOT DETECTED Final   Klebsiella oxytoca NOT DETECTED NOT DETECTED Final   Klebsiella pneumoniae NOT DETECTED NOT DETECTED Final   Proteus species NOT DETECTED NOT DETECTED Final   Salmonella species NOT DETECTED NOT DETECTED Final   Serratia marcescens NOT DETECTED NOT DETECTED Final   Haemophilus influenzae NOT DETECTED NOT DETECTED Final   Neisseria meningitidis NOT DETECTED NOT DETECTED Final   Pseudomonas aeruginosa NOT DETECTED NOT  DETECTED Final   Stenotrophomonas maltophilia NOT DETECTED NOT DETECTED Final   Candida albicans NOT DETECTED NOT DETECTED Final   Candida auris NOT DETECTED NOT DETECTED Final   Candida glabrata NOT DETECTED NOT DETECTED Final   Candida krusei NOT DETECTED NOT DETECTED Final   Candida parapsilosis NOT DETECTED NOT DETECTED Final   Candida tropicalis NOT DETECTED NOT DETECTED Final   Cryptococcus neoformans/gattii NOT DETECTED NOT DETECTED Final   CTX-M ESBL NOT DETECTED NOT DETECTED Final   Carbapenem resistance IMP NOT DETECTED NOT DETECTED Final   Carbapenem resistance KPC NOT DETECTED NOT DETECTED Final   Carbapenem resistance NDM NOT DETECTED NOT DETECTED Final   Carbapenem resist OXA 48 LIKE NOT DETECTED NOT DETECTED Final   Carbapenem resistance VIM NOT DETECTED NOT DETECTED Final    Comment: Performed at [facility], [address] [place], [place] [address]  Resp Panel by RT-PCR (Flu A&B, Covid) Nasopharyngeal Swab     Status: None   Collection Time: 09/18/21  5:11 PM   Specimen: Nasopharyngeal Swab; Nasopharyngeal(NP) swabs in vial transport medium  Result Value Ref Range Status   SARS Coronavirus 2 by RT PCR NEGATIVE NEGATIVE Final    Comment: (NOTE) SARS-CoV-2 target nucleic acids are NOT DETECTED.  The SARS-CoV-2 RNA is generally detectable in upper respiratory specimens during the acute phase of infection. The lowest concentration of SARS-CoV-2 viral copies this assay can detect is 138 copies/mL. A negative result does not preclude SARS-Cov-2 infection and should not be used as the sole basis for treatment or other patient management decisions. A negative result may occur with  improper specimen collection/handling, submission of specimen other than nasopharyngeal swab, presence of viral mutation(s) within the areas targeted by this assay, and inadequate number of viral copies(<138 copies/mL). A negative result must be combined with clinical observations,  patient history, and epidemiological information. The expected result is Negative.  Fact Sheet for Patients:  EntrepreneurPulse.com.au  Fact Sheet for Healthcare Providers:  IncredibleEmployment.be  This test is no t yet approved or cleared by the [place] FDA and  has been authorized for detection and/or diagnosis of SARS-CoV-2 by FDA under an Emergency Use Authorization (EUA). This EUA will remain  in effect (meaning this test can be used) for the duration of the COVID-19 declaration under Section 564(b)(1) of the Act, 21 U.S.C.section 360bbb-3(b)(1), unless the authorization is terminated  or revoked sooner.       Influenza A by PCR NEGATIVE NEGATIVE Final   Influenza B by PCR NEGATIVE NEGATIVE Final    Comment: (NOTE) The Xpert Xpress SARS-CoV-2/FLU/RSV plus assay is intended as an aid in the diagnosis of influenza from Nasopharyngeal swab specimens and should not be used as a sole basis for treatment. Nasal washings and aspirates are unacceptable for Xpert Xpress SARS-CoV-2/FLU/RSV testing.  Fact Sheet for Patients: EntrepreneurPulse.com.au  Fact Sheet for Healthcare Providers: IncredibleEmployment.be  This test is  not yet approved or cleared by the [place] and has been authorized for detection and/or diagnosis of SARS-CoV-2 by FDA under an Emergency Use Authorization (EUA). This EUA will remain in effect (meaning this test can be used) for the duration of the COVID-19 declaration under Section 564(b)(1) of the Act, 21 U.S.C. section 360bbb-3(b)(1), unless the authorization is terminated or revoked.  Performed at [facility], [address] [place], [place] [address]          Radiology Studies: DG OR UROLOGY CYSTO IMAGE ([place])  Result Date: 09/18/2021 There is no interpretation for this exam.  This order is for images obtained during a surgical procedure.   Please See "Surgeries" Tab for more information regarding the procedure.   CT Renal Stone Study  Result Date: 09/18/2021 CLINICAL DATA:  Flank pain, nausea EXAM: CT ABDOMEN AND PELVIS WITHOUT CONTRAST TECHNIQUE: Multidetector CT imaging of the abdomen and pelvis was performed following the standard protocol without IV contrast. RADIATION DOSE REDUCTION: This exam was performed according to the departmental dose-optimization program which includes automated exposure control, adjustment of the mA and/or kV according to patient size and/or use of iterative reconstruction technique. COMPARISON:  01/24/2017 FINDINGS: Lower chest: Included lung bases are clear.  Heart size is normal. Hepatobiliary: Unremarkable unenhanced appearance of the liver. No focal liver lesion identified. Gallbladder within normal limits. No hyperdense gallstone. No biliary dilatation. Pancreas: Unremarkable. No pancreatic ductal dilatation or surrounding inflammatory changes. Spleen: Normal in size without focal abnormality. Adrenals/Urinary Tract: Unremarkable adrenal glands. Obstructing 7 x 4 mm stone located at the right ureterovesical junction resulting in moderate right-sided hydroureteronephrosis. There is small-moderate volume of right perinephric fluid suggesting caliceal rupture. No additional stones within the right kidney. The left kidney is unremarkable. No left-sided renal stone or hydronephrosis. Urinary bladder is unremarkable. Stomach/Bowel: Stomach is within normal limits. Appendix appears normal (series 2, image 59). No evidence of bowel wall thickening, distention, or inflammatory changes. Vascular/Lymphatic: No significant vascular findings are present. No enlarged abdominal or pelvic lymph nodes. Reproductive: Uterus and bilateral adnexa are unremarkable. Other: No organized abdominopelvic fluid collection. No pneumoperitoneum. No abdominal wall hernia. Musculoskeletal: No acute or significant osseous findings.  Assimilation joint on the right at the lumbosacral junction. IMPRESSION: Obstructing 7 x 4 mm stone at the right ureterovesical junction resulting in moderate right-sided hydroureteronephrosis. Small-moderate volume of right perinephric fluid suggesting caliceal rupture. Electronically Signed   By: Davina Poke D.O.   On: 09/18/2021 17:36        Scheduled Meds:  Chlorhexidine Gluconate Cloth  6 each Topical Daily   enoxaparin (LOVENOX) injection  40 mg Subcutaneous Q24H   ketorolac  15 mg Intravenous Q6H   PARoxetine  20 mg Oral Daily   polyethylene glycol  17 g Oral Daily   senna-docusate  1 tablet Oral BID   vitamin B-12  1,000 mcg Oral Daily   Vitamin D (Ergocalciferol)  50,000 Units Oral Weekly   Continuous Infusions:  sodium chloride 125 mL/hr at 09/19/21 0915   [START ON 09/20/2021] cefTRIAXone (ROCEPHIN)  IV       LOS: 0 days       Sidney Ace, MD [facility]   If 7PM-7AM, please contact night-coverage  09/19/2021, 11:42 AM

## 2021-09-19 NOTE — Progress Notes (Signed)
° °  Subjective Afebrile overnight, right flank pain significantly improved  Physical Exam: BP 136/70 (BP Location: Right Arm)    Pulse 80    Temp 99.8 F (37.7 C)    Resp 18    Ht 5\' 4"  (1.626 m)    Wt 85.3 kg    SpO2 99%    BMI 32.27 kg/m    Constitutional:  Alert and oriented, No acute distress. Respiratory: Normal respiratory effort, no increased work of breathing. GI: Abdomen is soft, non-tender, non-distended Drains: Foley with tea colored urine  Laboratory Data: Reviewed, WBC downtrending, lactate normalized, normal renal function  Assessment & Plan:   51 year old healthy female POD#1 cystoscopy and right ureteral stent placement for 7 mm right distal ureteral stone with infection and upstream pyelonephritis.  Care plan discussed with Dr. Priscella Mann.  -Foley can be removed from urology perspective -Agree with broad-spectrum antibiotics and narrow as able, recommend 10 to 14-day course -Recommend ultimately discharging with Flomax 0.4 mg nightly, oxybutynin 10 mg XL daily to help with stent discomfort -Urology will coordinate outpatient ureteroscopy/laser lithotripsy in 2 to 3 weeks once infection cleared  I spent 20 total minutes on the floor with greater than 50% spent in counseling and coordination of care with the patient regarding infected ureteral stone, need for 10 to 14 days of antibiotics, and need for definitive management with ureteroscopy and laser lithotripsy in 2 to 3 weeks once infection treated   Billey Co, MD

## 2021-09-20 DIAGNOSIS — N201 Calculus of ureter: Secondary | ICD-10-CM | POA: Diagnosis not present

## 2021-09-20 DIAGNOSIS — N1330 Unspecified hydronephrosis: Secondary | ICD-10-CM | POA: Diagnosis not present

## 2021-09-20 DIAGNOSIS — N390 Urinary tract infection, site not specified: Secondary | ICD-10-CM | POA: Diagnosis not present

## 2021-09-20 DIAGNOSIS — N111 Chronic obstructive pyelonephritis: Secondary | ICD-10-CM | POA: Diagnosis not present

## 2021-09-20 LAB — BASIC METABOLIC PANEL
Anion gap: 7 (ref 5–15)
BUN: 13 mg/dL (ref 6–20)
CO2: 23 mmol/L (ref 22–32)
Calcium: 7.7 mg/dL — ABNORMAL LOW (ref 8.9–10.3)
Chloride: 109 mmol/L (ref 98–111)
Creatinine, Ser: 0.71 mg/dL (ref 0.44–1.00)
GFR, Estimated: >60 mL/min (ref >60)
Glucose, Bld: 103 mg/dL — ABNORMAL HIGH (ref 70–99)
Potassium: 3.6 mmol/L (ref 3.5–5.1)
Sodium: 139 mmol/L (ref 135–145)

## 2021-09-20 LAB — CBC WITH DIFFERENTIAL/PLATELET
Abs Immature Granulocytes: 0.06 10*3/uL (ref 0.00–0.07)
Basophils Absolute: 0 10*3/uL (ref 0.0–0.1)
Basophils Relative: 0 %
Eosinophils Absolute: 0 10*3/uL (ref 0.0–0.5)
Eosinophils Relative: 0 %
HCT: 30.2 % — ABNORMAL LOW (ref 36.0–46.0)
Hemoglobin: 9.5 g/dL — ABNORMAL LOW (ref 12.0–15.0)
Immature Granulocytes: 1 %
Lymphocytes Relative: 6 %
Lymphs Abs: 0.6 10*3/uL — ABNORMAL LOW (ref 0.7–4.0)
MCH: 29.6 pg (ref 26.0–34.0)
MCHC: 31.5 g/dL (ref 30.0–36.0)
MCV: 94.1 fL (ref 80.0–100.0)
Monocytes Absolute: 0.8 10*3/uL (ref 0.1–1.0)
Monocytes Relative: 8 %
Neutro Abs: 8 10*3/uL — ABNORMAL HIGH (ref 1.7–7.7)
Neutrophils Relative %: 85 %
Platelets: 221 10*3/uL (ref 150–400)
RBC: 3.21 MIL/uL — ABNORMAL LOW (ref 3.87–5.11)
RDW: 14.1 % (ref 11.5–15.5)
WBC: 9.5 10*3/uL (ref 4.0–10.5)
nRBC: 0 % (ref 0.0–0.2)

## 2021-09-20 MED ORDER — OXYCODONE HCL 5 MG PO TABS: 5.0000 mg | ORAL_TABLET | Freq: Three times a day (TID) | ORAL | 0 refills | Status: AC | PRN

## 2021-09-20 MED ORDER — SENNOSIDES-DOCUSATE SODIUM 8.6-50 MG PO TABS: 1.0000 | ORAL_TABLET | Freq: Two times a day (BID) | ORAL | 0 refills | Status: AC | PRN

## 2021-09-20 MED ORDER — OXYBUTYNIN CHLORIDE ER 10 MG PO TB24
10.0000 mg | ORAL_TABLET | Freq: Every day | ORAL | Status: DC
  Administered 2021-09-20: 10 mg via ORAL
  Filled 2021-09-20: qty 1

## 2021-09-20 MED ORDER — TAMSULOSIN HCL 0.4 MG PO CAPS
0.4000 mg | ORAL_CAPSULE | Freq: Every day | ORAL | 0 refills | Status: DC
Start: 2021-09-20 — End: 2021-10-02

## 2021-09-20 MED ORDER — OXYBUTYNIN CHLORIDE ER 10 MG PO TB24: 10.0000 mg | ORAL_TABLET | Freq: Every day | ORAL | 0 refills | Status: DC

## 2021-09-20 MED ORDER — ONDANSETRON HCL 4 MG PO TABS: 4.0000 mg | ORAL_TABLET | Freq: Four times a day (QID) | ORAL | 0 refills | Status: AC | PRN

## 2021-09-20 MED ORDER — CIPROFLOXACIN HCL 500 MG PO TABS
500.0000 mg | ORAL_TABLET | Freq: Two times a day (BID) | ORAL | 0 refills | Status: AC
Start: 2021-09-20 — End: 2021-10-02

## 2021-09-20 NOTE — Progress Notes (Signed)
° °  Subjective Continues to improve, persistent right-sided back pain Urinary urgency and frequency with stent in place  Physical Exam: BP 110/79 (BP Location: Left Arm)    Pulse 88    Temp 98.7 F (37.1 C) (Oral)    Resp 18    Ht 5\' 4"  (1.626 m)    Wt 85.3 kg    SpO2 95%    BMI 32.27 kg/m    Constitutional:  Alert and oriented, No acute distress. Respiratory: Normal respiratory effort, no increased work of breathing. GI: Abdomen is soft, non-tender, non-distended  Laboratory Data: Reviewed in epic WBC normalized  Assessment & Plan:   51 year old healthy female POD#2 cystoscopy and right ureteral stent placement for 7 mm right distal ureteral stone with infection and upstream pyelonephritis.  Care plan discussed with Dr. Priscella Mann.   -Agree with broad-spectrum antibiotics and narrow as able, recommend 10 to 14-day course -Recommend ultimately discharging with Flomax 0.4 mg nightly, oxybutynin 10 mg XL daily to help with stent discomfort.  Agree that discharge today with oral antibiotics reasonable and follow-up cultures. -Urology will coordinate outpatient ureteroscopy/laser lithotripsy in 2 to 3 weeks once infection cleared   I spent 20 total minutes on the floor with greater than 50% spent in counseling and coordination of care with the patient regarding infected ureteral stone, need for 10 to 14 days of antibiotics, stent related symptoms of flank pain/urgency/frequency/dysuria and medication options, and need for definitive management with ureteroscopy and laser lithotripsy in 2 to 3 weeks once infection treated.  Billey Co, MD

## 2021-09-20 NOTE — Discharge Summary (Signed)
Physician Discharge Summary  Janet Harmon BWL:[id number] DOB: Apr 11, 1971 DOA: 09/18/2021  PCP: Wayland Denis, PA-C  Admit date: 09/18/2021 Discharge date: 09/20/2021  Admitted From: Home Disposition: Home  Recommendations for Outpatient Follow-up:  Follow up with PCP in 1-2 weeks Follow-up with urology as directed  Home Health: No Equipment/Devices: None  Discharge Condition: Stable CODE STATUS: Full Diet recommendation: Regular  Brief/Interim Summary: 51 year old with history of generalized anxiety disorder, prediabetes, fatty infiltration of the liver, hyperlipidemia, vitamin D deficiency, vitamin B12 deficiency, who presents to emergency department for chief concerns of mid to lower right-sided back pain.  Serum sodium in the emergency department showed serum sodium was 137, potassium 4.3, chloride 104, bicarb 20, nonfasting blood glucose 118, BUN of 12, serum creatinine of 0.91, GFR of greater than 60.   WBC was significant elevated at 22.8, hemoglobin 11.5, platelets of 318.   Lactic acid was initially 1.3 and about 1 hour and 45 minutes later it was collected and found to be 2.8.   CT renal study ordered: Read as obstructing 7 x 4 mm stone at the right ureterovesical junction resulting in moderate right-sided hydroureteronephrosis.   Urology consulted.  Patient taken urgently to operating room for a cystoscopy and right ureteral stent placement.  Doing well post procedurally.  Seen in follow-up by urology.  Still with some residual stent related pain.  Otherwise stable.  Can discharge home at this time.  We will treat empirically with ciprofloxacin 500 mg p.o. twice daily for total course of 14 days for gram-negative sepsis secondary to UTI and obstructive pyelonephritis and bacteremia.  Pain management to include Ditropan, Flomax, oxycodone as needed.  Stable for discharge home.  We will follow-up with urology for stent removal and definitive stone  retrieval    Discharge Diagnoses:  Principal Problem:   Obstructive pyelonephritis Active Problems:   Obesity, Class II, BMI 35-39.9   Fatty infiltration of liver   Generalized anxiety disorder   Prediabetes   Leukocytosis   Sepsis secondary to UTI [facility])   Pyelonephritis  Obstructive pyelonephritis/nephrolithiasis Patient status post cystoscopy with right ureteral stent placement Doing well postoperatively Care plan discussed with Dr. Diamantina Providence Plan: De-escalate antibiotics to ciprofloxacin 500 mg p.o. twice daily.  Pain control with Disher pan XL 10 mg daily, Flomax 0.4 mg daily, oxycodone 5 mg every 6 hours as needed.  Discharged home on above regimen.  Follow-up outpatient urology for stent retrieval and definitive stone removal within 2 weeks   Sepsis secondary to urinary tract infection E. coli bacteremia In setting of urolithiasis Pain improved at time of discharge.  See above for discharge planning   Constipation Multimodal bowel regimen   Generalized anxiety disorder PTA Paxil  Discharge Instructions  Discharge Instructions     Diet - low sodium heart healthy   Complete by: As directed    Discharge instructions   Complete by: As directed    You have a ureteral stent in place.  This is a tube that extends from your kidney to your bladder.  This may cause blood in the urine, burning with urination, urinary frequency and urgency, and even some flank pain on that side.  Please call our office or present to the ED if you develop fevers >101.3 or pain which is not able to be controlled with oral pain medications. Anti-inflammatories like aleve, ibuprofen, or motrin work best for stent pain compared to narcotics. Take flomax nightly to help with stent pain. You can also take oxybutynin daily as needed for  bladder spasms from the stent. Pyridium(Azo) can be used over the counter to help with burning/stinging with urination, and this will turn the urine orange in color. It is  normal to have pink and bloody urine with the stent in place. Drink plenty of fluids to keep the bladder flushed.  No activity restrictions, but some people will notice the more physically active they are the more the stent is bothersome.  Please call the office if you have any questions or concerns.  The office will call you to schedule ureteroscopy and stone removal in 2 to 3 weeks once her infection has been treated.  [place] [address] [place], [place] [address] [phone]   Increase activity slowly   Complete by: As directed    No wound care   Complete by: As directed       Allergies as of 09/20/2021   No Known Allergies      Medication List     TAKE these medications    ciprofloxacin 500 MG tablet Commonly known as: Cipro Take 1 tablet (500 mg total) by mouth 2 (two) times daily for 12 days.   ondansetron 4 MG tablet Commonly known as: ZOFRAN Take 1 tablet (4 mg total) by mouth every 6 (six) hours as needed for nausea.   oxybutynin 10 MG 24 hr tablet Commonly known as: DITROPAN-XL Take 1 tablet (10 mg total) by mouth daily at 12 noon for 14 days. Start taking on: September 21, 2021   oxyCODONE 5 MG immediate release tablet Commonly known as: Roxicodone Take 1 tablet (5 mg total) by mouth every 8 (eight) hours as needed.   PARoxetine 20 MG tablet Commonly known as: PAXIL Take 20 mg by mouth daily.   senna-docusate 8.6-50 MG tablet Commonly known as: Senokot-S Take 1 tablet by mouth 2 (two) times daily as needed for up to 14 days for mild constipation.   tamsulosin 0.4 MG Caps capsule Commonly known as: Flomax Take 1 capsule (0.4 mg total) by mouth daily for 14 days.   vitamin B-12 1000 MCG tablet Commonly known as: CYANOCOBALAMIN Take 1,000 mcg by mouth daily.   Vitamin D (Ergocalciferol) 1.25 MG (50000 UNIT) Caps capsule Commonly known as: DRISDOL Take 50,000 Units by mouth once a week.        No  Known Allergies  Consultations: Urology   Procedures/Studies: DG OR UROLOGY CYSTO IMAGE ([place])  Result Date: 09/18/2021 There is no interpretation for this exam.  This order is for images obtained during a surgical procedure.  Please See "Surgeries" Tab for more information regarding the procedure.   CT Renal Stone Study  Result Date: 09/18/2021 CLINICAL DATA:  Flank pain, nausea EXAM: CT ABDOMEN AND PELVIS WITHOUT CONTRAST TECHNIQUE: Multidetector CT imaging of the abdomen and pelvis was performed following the standard protocol without IV contrast. RADIATION DOSE REDUCTION: This exam was performed according to the departmental dose-optimization program which includes automated exposure control, adjustment of the mA and/or kV according to patient size and/or use of iterative reconstruction technique. COMPARISON:  01/24/2017 FINDINGS: Lower chest: Included lung bases are clear.  Heart size is normal. Hepatobiliary: Unremarkable unenhanced appearance of the liver. No focal liver lesion identified. Gallbladder within normal limits. No hyperdense gallstone. No biliary dilatation. Pancreas: Unremarkable. No pancreatic ductal dilatation or surrounding inflammatory changes. Spleen: Normal in size without focal abnormality. Adrenals/Urinary Tract: Unremarkable adrenal glands. Obstructing 7 x 4 mm stone located at the right ureterovesical junction resulting in moderate  right-sided hydroureteronephrosis. There is small-moderate volume of right perinephric fluid suggesting caliceal rupture. No additional stones within the right kidney. The left kidney is unremarkable. No left-sided renal stone or hydronephrosis. Urinary bladder is unremarkable. Stomach/Bowel: Stomach is within normal limits. Appendix appears normal (series 2, image 59). No evidence of bowel wall thickening, distention, or inflammatory changes. Vascular/Lymphatic: No significant vascular findings are present. No enlarged abdominal or pelvic  lymph nodes. Reproductive: Uterus and bilateral adnexa are unremarkable. Other: No organized abdominopelvic fluid collection. No pneumoperitoneum. No abdominal wall hernia. Musculoskeletal: No acute or significant osseous findings. Assimilation joint on the right at the lumbosacral junction. IMPRESSION: Obstructing 7 x 4 mm stone at the right ureterovesical junction resulting in moderate right-sided hydroureteronephrosis. Small-moderate volume of right perinephric fluid suggesting caliceal rupture. Electronically Signed   By: Davina Poke D.O.   On: 09/18/2021 17:36      Subjective: Seen and examined on day of discharge.  Stable.  Mild distress due to stent pain.  Stable for discharge home.  Discharge Exam: Vitals:   09/20/21 0540 09/20/21 0757  BP:  110/79  Pulse:  88  Resp:  18  Temp: 99.1 F (37.3 C) 98.7 F (37.1 C)  SpO2:  95%   Vitals:   09/19/21 2142 09/20/21 0455 09/20/21 0540 09/20/21 0757  BP: (!) 108/56 112/77  110/79  Pulse: 95 85  88  Resp: 20 16  18   Temp: 98.6 F (37 C) 98.8 F (37.1 C) 99.1 F (37.3 C) 98.7 F (37.1 C)  TempSrc: Oral Oral Oral Oral  SpO2: 96% 97%  95%  Weight:      Height:        General: Pt is alert, awake, not in acute distress Cardiovascular: RRR, S1/S2 +, no rubs, no gallops Respiratory: CTA bilaterally, no wheezing, no rhonchi Abdominal: Soft, NT, ND, bowel sounds + Extremities: no edema, no cyanosis    The results of significant diagnostics from this hospitalization (including imaging, microbiology, ancillary and laboratory) are listed below for reference.     Microbiology: Recent Results (from the past 240 hour(s))  Culture, blood (single)     Status: Abnormal (Preliminary result)   Collection Time: 09/18/21  5:09 PM   Specimen: BLOOD  Result Value Ref Range Status   Specimen Description   Final    BLOOD RAC Performed at [facility], [address], [place], [place] [address]    Special Requests   Final     BOTTLES DRAWN AEROBIC AND ANAEROBIC BCAV Performed at [facility], [address], [place], [place] [address]    Culture  Setup Time   Final    GRAM NEGATIVE RODS IN BOTH AEROBIC AND ANAEROBIC BOTTLES CRITICAL RESULT CALLED TO, READ BACK BY AND VERIFIED WITH: CAROLYN CHILDS AT 1109 09/19/21.PMF GRAM STAIN REVIEWED-AGREE WITH RESULT    Culture (A)  Final    ESCHERICHIA COLI SUSCEPTIBILITIES TO FOLLOW Performed at [facility], [place] [address], [place], [place] [address]    Report Status PENDING  Incomplete  Urine Culture     Status: Abnormal (Preliminary result)   Collection Time: 09/18/21  5:09 PM   Specimen: Urine, Random  Result Value Ref Range Status   Specimen Description   Final    URINE, RANDOM Performed at [facility], [address] [place], [place] [address]    Special Requests   Final    NONE Performed at [facility], [address], [place],  [address]  Culture (A)  Final    >=100,000 COLONIES/mL ESCHERICHIA COLI SUSCEPTIBILITIES TO FOLLOW Performed at [facility] [address] [place], [place] [address]    Report Status PENDING  Incomplete  Blood Culture ID Panel (Reflexed)     Status: Abnormal   Collection Time: 09/18/21  5:09 PM  Result Value Ref Range Status   Enterococcus faecalis NOT DETECTED NOT DETECTED Final   Enterococcus Faecium NOT DETECTED NOT DETECTED Final   Listeria monocytogenes NOT DETECTED NOT DETECTED Final   Staphylococcus species NOT DETECTED NOT DETECTED Final   Staphylococcus aureus (BCID) NOT DETECTED NOT DETECTED Final   Staphylococcus epidermidis NOT DETECTED NOT DETECTED Final   Staphylococcus lugdunensis NOT DETECTED NOT DETECTED Final   Streptococcus species NOT DETECTED NOT DETECTED Final   Streptococcus agalactiae NOT DETECTED NOT DETECTED Final   Streptococcus pneumoniae NOT DETECTED NOT DETECTED Final   Streptococcus pyogenes NOT DETECTED NOT DETECTED Final    A.calcoaceticus-baumannii NOT DETECTED NOT DETECTED Final   Bacteroides fragilis NOT DETECTED NOT DETECTED Final   Enterobacterales DETECTED (A) NOT DETECTED Final    Comment: Enterobacterales represent a large order of gram negative bacteria, not a single organism. CRITICAL RESULT CALLED TO, READ BACK BY AND VERIFIED WITH: CAROLYN CHILDS AT 1109 09/19/21.PMF    Enterobacter cloacae complex NOT DETECTED NOT DETECTED Final   Escherichia coli DETECTED (A) NOT DETECTED Final    Comment: CRITICAL RESULT CALLED TO, READ BACK BY AND VERIFIED WITH: CAROLYN CHILDS AT 1109 09/19/21.PMF    Klebsiella aerogenes NOT DETECTED NOT DETECTED Final   Klebsiella oxytoca NOT DETECTED NOT DETECTED Final   Klebsiella pneumoniae NOT DETECTED NOT DETECTED Final   Proteus species NOT DETECTED NOT DETECTED Final   Salmonella species NOT DETECTED NOT DETECTED Final   Serratia marcescens NOT DETECTED NOT DETECTED Final   Haemophilus influenzae NOT DETECTED NOT DETECTED Final   Neisseria meningitidis NOT DETECTED NOT DETECTED Final   Pseudomonas aeruginosa NOT DETECTED NOT DETECTED Final   Stenotrophomonas maltophilia NOT DETECTED NOT DETECTED Final   Candida albicans NOT DETECTED NOT DETECTED Final   Candida auris NOT DETECTED NOT DETECTED Final   Candida glabrata NOT DETECTED NOT DETECTED Final   Candida krusei NOT DETECTED NOT DETECTED Final   Candida parapsilosis NOT DETECTED NOT DETECTED Final   Candida tropicalis NOT DETECTED NOT DETECTED Final   Cryptococcus neoformans/gattii NOT DETECTED NOT DETECTED Final   CTX-M ESBL NOT DETECTED NOT DETECTED Final   Carbapenem resistance IMP NOT DETECTED NOT DETECTED Final   Carbapenem resistance KPC NOT DETECTED NOT DETECTED Final   Carbapenem resistance NDM NOT DETECTED NOT DETECTED Final   Carbapenem resist OXA 48 LIKE NOT DETECTED NOT DETECTED Final   Carbapenem resistance VIM NOT DETECTED NOT DETECTED Final    Comment: Performed at [facility], [address] [place], [place] [address]  Resp Panel by RT-PCR (Flu A&B, Covid) Nasopharyngeal Swab     Status: None   Collection Time: 09/18/21  5:11 PM   Specimen: Nasopharyngeal Swab; Nasopharyngeal(NP) swabs in vial transport medium  Result Value Ref Range Status   SARS Coronavirus 2 by RT PCR NEGATIVE NEGATIVE Final    Comment: (NOTE) SARS-CoV-2 target nucleic acids are NOT DETECTED.  The SARS-CoV-2 RNA is generally detectable in upper respiratory specimens during the acute phase of infection. The lowest concentration of SARS-CoV-2 viral copies this assay can detect is 138 copies/mL. A negative result does not preclude SARS-Cov-2 infection and should not be used as the  sole basis for treatment or other patient management decisions. A negative result may occur with  improper specimen collection/handling, submission of specimen other than nasopharyngeal swab, presence of viral mutation(s) within the areas targeted by this assay, and inadequate number of viral copies(<138 copies/mL). A negative result must be combined with clinical observations, patient history, and epidemiological information. The expected result is Negative.  Fact Sheet for Patients:  EntrepreneurPulse.com.au  Fact Sheet for Healthcare Providers:  IncredibleEmployment.be  This test is no t yet approved or cleared by the [place] FDA and  has been authorized for detection and/or diagnosis of SARS-CoV-2 by FDA under an Emergency Use Authorization (EUA). This EUA will remain  in effect (meaning this test can be used) for the duration of the COVID-19 declaration under Section 564(b)(1) of the Act, 21 U.S.C.section 360bbb-3(b)(1), unless the authorization is terminated  or revoked sooner.       Influenza A by PCR NEGATIVE NEGATIVE Final   Influenza B by PCR NEGATIVE NEGATIVE Final    Comment: (NOTE) The Xpert Xpress SARS-CoV-2/FLU/RSV plus assay is intended as an aid in  the diagnosis of influenza from Nasopharyngeal swab specimens and should not be used as a sole basis for treatment. Nasal washings and aspirates are unacceptable for Xpert Xpress SARS-CoV-2/FLU/RSV testing.  Fact Sheet for Patients: EntrepreneurPulse.com.au  Fact Sheet for Healthcare Providers: IncredibleEmployment.be  This test is not yet approved or cleared by the [place] FDA and has been authorized for detection and/or diagnosis of SARS-CoV-2 by FDA under an Emergency Use Authorization (EUA). This EUA will remain in effect (meaning this test can be used) for the duration of the COVID-19 declaration under Section 564(b)(1) of the Act, 21 U.S.C. section 360bbb-3(b)(1), unless the authorization is terminated or revoked.  Performed at [facility], [address] [place], [place] [address]      Labs: BNP (last 3 results) No results for input(s): BNP in the last 8760 hours. Basic Metabolic Panel: Recent Labs  Lab 09/18/21 1709 09/19/21 0121 09/20/21 0741  NA 137 138 139  K 4.3 4.0 3.6  CL 104 106 109  CO2 20* 26 23  GLUCOSE 118* 193* 103*  BUN 12 12 13   CREATININE 0.91 0.80 0.71  CALCIUM 8.6* 8.5* 7.7*   Liver Function Tests: Recent Labs  Lab 09/18/21 1709  AST 28  ALT 18  ALKPHOS 80  BILITOT 1.8*  PROT 7.8  ALBUMIN 3.8   No results for input(s): LIPASE, AMYLASE in the last 168 hours. No results for input(s): AMMONIA in the last 168 hours. CBC: Recent Labs  Lab 09/18/21 1540 09/19/21 0121 09/20/21 0741  WBC 22.8* 18.6* 9.5  NEUTROABS 20.3* 16.9* 8.0*  HGB 11.5* 10.7* 9.5*  HCT 36.2 33.4* 30.2*  MCV 92.1 94.1 94.1  PLT 318 253 221   Cardiac Enzymes: No results for input(s): CKTOTAL, CKMB, CKMBINDEX, TROPONINI in the last 168 hours. BNP: Invalid input(s): POCBNP CBG: No results for input(s): GLUCAP in the last 168 hours. D-Dimer No results for input(s): DDIMER in the last 72 hours. Hgb A1c No  results for input(s): HGBA1C in the last 72 hours. Lipid Profile No results for input(s): CHOL, HDL, LDLCALC, TRIG, CHOLHDL, LDLDIRECT in the last 72 hours. Thyroid function studies No results for input(s): TSH, T4TOTAL, T3FREE, THYROIDAB in the last 72 hours.  Invalid input(s): FREET3 Anemia work up No results for input(s): VITAMINB12, FOLATE, FERRITIN, TIBC, IRON, RETICCTPCT in the last 72 hours. Urinalysis    Component Value Date/Time  COLORURINE YELLOW (A) 09/18/2021 1709   APPEARANCEUR CLOUDY (A) 09/18/2021 1709   APPEARANCEUR Hazy 06/24/2014 0245   LABSPEC 1.009 09/18/2021 1709   LABSPEC 1.024 06/24/2014 0245   PHURINE 6.0 09/18/2021 1709   GLUCOSEU NEGATIVE 09/18/2021 1709   GLUCOSEU Negative 06/24/2014 0245   HGBUR LARGE (A) 09/18/2021 1709   BILIRUBINUR NEGATIVE 09/18/2021 1709   BILIRUBINUR Negative 07/05/2018 1434   BILIRUBINUR Negative 06/24/2014 0245   KETONESUR NEGATIVE 09/18/2021 1709   PROTEINUR 100 (A) 09/18/2021 1709   UROBILINOGEN 0.2 07/05/2018 1434   NITRITE POSITIVE (A) 09/18/2021 1709   LEUKOCYTESUR LARGE (A) 09/18/2021 1709   LEUKOCYTESUR Negative 06/24/2014 0245   Sepsis Labs Invalid input(s): PROCALCITONIN,  WBC,  LACTICIDVEN Microbiology Recent Results (from the past 240 hour(s))  Culture, blood (single)     Status: Abnormal (Preliminary result)   Collection Time: 09/18/21  5:09 PM   Specimen: BLOOD  Result Value Ref Range Status   Specimen Description   Final    BLOOD RAC Performed at [facility], [address] [place], [place] [address]    Special Requests   Final    BOTTLES DRAWN AEROBIC AND ANAEROBIC BCAV Performed at [facility], [address] [place], [place] [address]    Culture  Setup Time   Final    GRAM NEGATIVE RODS IN BOTH AEROBIC AND ANAEROBIC BOTTLES CRITICAL RESULT CALLED TO, READ BACK BY AND VERIFIED WITH: CAROLYN CHILDS AT 1109 09/19/21.PMF GRAM STAIN REVIEWED-AGREE WITH RESULT    Culture (A)   Final    ESCHERICHIA COLI SUSCEPTIBILITIES TO FOLLOW Performed at [facility], [place] [address], [place], [place] [address]    Report Status PENDING  Incomplete  Urine Culture     Status: Abnormal (Preliminary result)   Collection Time: 09/18/21  5:09 PM   Specimen: Urine, Random  Result Value Ref Range Status   Specimen Description   Final    URINE, RANDOM Performed at [facility], [address], [place], [place] [address]    Special Requests   Final    NONE Performed at [facility], [address] [place], [place] [address]    Culture (A)  Final    >=100,000 COLONIES/mL ESCHERICHIA COLI SUSCEPTIBILITIES TO FOLLOW Performed at [facility], [place] [address] [place],  [address]    Report Status PENDING  Incomplete  Blood Culture ID Panel (Reflexed)     Status: Abnormal   Collection Time: 09/18/21  5:09 PM  Result Value Ref Range Status   Enterococcus faecalis NOT DETECTED NOT DETECTED Final   Enterococcus Faecium NOT DETECTED NOT DETECTED Final   Listeria monocytogenes NOT DETECTED NOT DETECTED Final   Staphylococcus species NOT DETECTED NOT DETECTED Final   Staphylococcus aureus (BCID) NOT DETECTED NOT DETECTED Final   Staphylococcus epidermidis NOT DETECTED NOT DETECTED Final   Staphylococcus lugdunensis NOT DETECTED NOT DETECTED Final   Streptococcus species NOT DETECTED NOT DETECTED Final   Streptococcus agalactiae NOT DETECTED NOT DETECTED Final   Streptococcus pneumoniae NOT DETECTED NOT DETECTED Final   Streptococcus pyogenes NOT DETECTED NOT DETECTED Final   A.calcoaceticus-baumannii NOT DETECTED NOT DETECTED Final   Bacteroides fragilis NOT DETECTED NOT DETECTED Final   Enterobacterales DETECTED (A) NOT DETECTED Final    Comment: Enterobacterales represent a large order of gram negative bacteria, not a single organism. CRITICAL RESULT CALLED TO, READ BACK BY AND VERIFIED WITH: CAROLYN CHILDS AT 1109 09/19/21.PMF     Enterobacter cloacae complex NOT  DETECTED NOT DETECTED Final   Escherichia coli DETECTED (A) NOT DETECTED Final    Comment: CRITICAL RESULT CALLED TO, READ BACK BY AND VERIFIED WITH: CAROLYN CHILDS AT 1109 09/19/21.PMF    Klebsiella aerogenes NOT DETECTED NOT DETECTED Final   Klebsiella oxytoca NOT DETECTED NOT DETECTED Final   Klebsiella pneumoniae NOT DETECTED NOT DETECTED Final   Proteus species NOT DETECTED NOT DETECTED Final   Salmonella species NOT DETECTED NOT DETECTED Final   Serratia marcescens NOT DETECTED NOT DETECTED Final   Haemophilus influenzae NOT DETECTED NOT DETECTED Final   Neisseria meningitidis NOT DETECTED NOT DETECTED Final   Pseudomonas aeruginosa NOT DETECTED NOT DETECTED Final   Stenotrophomonas maltophilia NOT DETECTED NOT DETECTED Final   Candida albicans NOT DETECTED NOT DETECTED Final   Candida auris NOT DETECTED NOT DETECTED Final   Candida glabrata NOT DETECTED NOT DETECTED Final   Candida krusei NOT DETECTED NOT DETECTED Final   Candida parapsilosis NOT DETECTED NOT DETECTED Final   Candida tropicalis NOT DETECTED NOT DETECTED Final   Cryptococcus neoformans/gattii NOT DETECTED NOT DETECTED Final   CTX-M ESBL NOT DETECTED NOT DETECTED Final   Carbapenem resistance IMP NOT DETECTED NOT DETECTED Final   Carbapenem resistance KPC NOT DETECTED NOT DETECTED Final   Carbapenem resistance NDM NOT DETECTED NOT DETECTED Final   Carbapenem resist OXA 48 LIKE NOT DETECTED NOT DETECTED Final   Carbapenem resistance VIM NOT DETECTED NOT DETECTED Final    Comment: Performed at [facility], [address] [place], [place] [address]  Resp Panel by RT-PCR (Flu A&B, Covid) Nasopharyngeal Swab     Status: None   Collection Time: 09/18/21  5:11 PM   Specimen: Nasopharyngeal Swab; Nasopharyngeal(NP) swabs in vial transport medium  Result Value Ref Range Status   SARS Coronavirus 2 by RT PCR NEGATIVE NEGATIVE Final    Comment: (NOTE) SARS-CoV-2 target nucleic  acids are NOT DETECTED.  The SARS-CoV-2 RNA is generally detectable in upper respiratory specimens during the acute phase of infection. The lowest concentration of SARS-CoV-2 viral copies this assay can detect is 138 copies/mL. A negative result does not preclude SARS-Cov-2 infection and should not be used as the sole basis for treatment or other patient management decisions. A negative result may occur with  improper specimen collection/handling, submission of specimen other than nasopharyngeal swab, presence of viral mutation(s) within the areas targeted by this assay, and inadequate number of viral copies(<138 copies/mL). A negative result must be combined with clinical observations, patient history, and epidemiological information. The expected result is Negative.  Fact Sheet for Patients:  EntrepreneurPulse.com.au  Fact Sheet for Healthcare Providers:  IncredibleEmployment.be  This test is no t yet approved or cleared by the [place] FDA and  has been authorized for detection and/or diagnosis of SARS-CoV-2 by FDA under an Emergency Use Authorization (EUA). This EUA will remain  in effect (meaning this test can be used) for the duration of the COVID-19 declaration under Section 564(b)(1) of the Act, 21 U.S.C.section 360bbb-3(b)(1), unless the authorization is terminated  or revoked sooner.       Influenza A by PCR NEGATIVE NEGATIVE Final   Influenza B by PCR NEGATIVE NEGATIVE Final    Comment: (NOTE) The Xpert Xpress SARS-CoV-2/FLU/RSV plus assay is intended as an aid in the diagnosis of influenza from Nasopharyngeal swab specimens and should not be used as a sole basis for treatment. Nasal washings and aspirates are unacceptable for Xpert Xpress SARS-CoV-2/FLU/RSV testing.  Fact Sheet for Patients: EntrepreneurPulse.com.au  Fact Sheet for Healthcare Providers: IncredibleEmployment.be  This  test is not yet approved or cleared by the [place] FDA and has been authorized for detection and/or diagnosis of SARS-CoV-2 by FDA under an Emergency Use Authorization (EUA). This EUA will remain in effect (meaning this test can be used) for the duration of the COVID-19 declaration under Section 564(b)(1) of the Act, 21 U.S.C. section 360bbb-3(b)(1), unless the authorization is terminated or revoked.  Performed at [facility], [address] [place], [place] [address]      Time coordinating discharge: Over 30 minutes  SIGNED:   Sidney Ace, MD  [facility] 09/20/2021, 11:41 AM Pager   If 7PM-7AM, please contact night-coverage

## 2021-09-20 NOTE — TOC Transition Note (Signed)
Transition of Care [facility]) - CM/SW Discharge Note   Patient Details  Name: Janet Harmon MRN: [id number] Date of Birth: 1971/07/04  [facility]) CM/SW Contact:  Izola Price, RN Phone Number: 09/20/2021, 12:17 PM   Clinical Narrative:  2/12: DC orders for today to home/self care with provider notes indicating follow up recommendations as follows:  "Follow up with PCP in 1-2 weeks Follow-up with urology as directed". Additional information from chart: [facility] private insurance active.  PCP: Dr. Wayland Denis. RX:   [id number] [name], [place]: Everardo Pacific (Spouse)  [phone] (Home Phone). No other TOC needs identified on discharge plan/review.  Simmie Davies RN CM      Final next level of care: Home w Home Health Services Barriers to Discharge: Barriers Resolved   Patient Goals and CMS Choice     Choice offered to / list presented to : NA  Discharge Placement                       Discharge Plan and Services                DME Arranged: N/A DME Agency: NA       HH Arranged: NA HH Agency: NA        Social Determinants of Health (SDOH) Interventions     Readmission Risk Interventions No flowsheet data found.

## 2021-09-21 LAB — URINE CULTURE: Culture: >=100000 — AB

## 2021-09-22 ENCOUNTER — Other Ambulatory Visit: Payer: Self-pay | Admitting: Urology

## 2021-09-22 DIAGNOSIS — N201 Calculus of ureter: Secondary | ICD-10-CM

## 2021-09-22 LAB — CULTURE, BLOOD (SINGLE)

## 2021-09-22 NOTE — Progress Notes (Signed)
Surgical Physician Order Form [facility] [place]  * Scheduling expectation :  2 to 3 weeks  *Length of Case: 1 hour  *Clearance needed: no  *Anticoagulation Instructions: May continue all anticoagulants  *Aspirin Instructions: Ok to continue all  *Post-op visit Date/Instructions: TBD  *Diagnosis: Right Ureteral Stone  *Procedure: right  Ureteroscopy w/laser lithotripsy & stent exchange (13244)   Additional orders: N/A  -Admit type: OUTpatient  -Anesthesia: General  -VTE Prophylaxis Standing Order SCDs       Other:   -Standing Lab Orders Per Anesthesia    Lab other: None  -Standing Test orders EKG/Chest x-ray per Anesthesia       Test other:   - Medications:  Cipro 400mg  IV  -Other orders:  N/A

## 2021-09-25 ENCOUNTER — Telehealth: Payer: Self-pay

## 2021-09-25 NOTE — Telephone Encounter (Signed)
I spoke with Janet Harmon . We have discussed possible surgery dates and Friday February 24th, 2023 was agreed upon by all parties. Patient given information about surgery date, what to expect pre-operatively and post operatively.   We discussed that a Pre-Admission Testing office will be calling to set up the pre-op visit that will take place prior to surgery, and that these appointments are typically done over the phone with a Pre-Admissions RN.   Informed patient that our office will communicate any additional care to be provided after surgery. Patients questions or concerns were discussed during our call. Advised to call our office should there be any additional information, questions or concerns that arise. Patient verbalized understanding.

## 2021-09-25 NOTE — Progress Notes (Signed)
[facility] Posting Form   Surgery Date/Time: Date: 10/02/2021  Surgeon: Dr. Nickolas Madrid, MD  Surgery Location: Day Surgery  Inpt ( No  )   Outpt (Yes)   Obs ( No  )   Diagnosis: N20.1 Right Ureteral Stone  -CPT: [id number]  Surgery: Right Ureteroscopy with laser lithotripsy and stent exchange  Stop Anticoagulations: No, may continue ASA  Cardiac/Medical/Pulmonary Clearance needed: no  *Orders entered into EPIC  Date: 09/25/21   *Case booked in [place]  Date: 09/25/21  *Notified pt of Surgery: Date: 09/25/21  PRE-OP UA & CX: no  *Placed into Prior Authorization Work Que Date: 09/25/21   Assistant/laser/rep:No

## 2021-09-30 ENCOUNTER — Encounter
Admission: RE | Admit: 2021-09-30 | Discharge: 2021-09-30 | Disposition: A | Discharge status: Home / Self Care | Payer: 59 | Source: Ambulatory Visit | Attending: Urology | Admitting: Urology

## 2021-09-30 ENCOUNTER — Other Ambulatory Visit: Payer: Self-pay

## 2021-09-30 HISTORY — DX: Generalized anxiety disorder: F41.1

## 2021-09-30 HISTORY — DX: Prediabetes: R73.03

## 2021-09-30 HISTORY — DX: Vitamin D deficiency, unspecified: E55.9

## 2021-09-30 HISTORY — DX: Deficiency of other specified B group vitamins: E53.8

## 2021-09-30 NOTE — Progress Notes (Signed)
Unable to reach patient via phone numbers listed on file. Phone numbers are not in service. Sent a secure chat to [facility] regarding this issue. She reached out to the patient's emergency contact. Awaiting a return phone call.

## 2021-09-30 NOTE — Patient Instructions (Addendum)
Your procedure is scheduled on: 10/02/2021 Report to the Registration Desk on the 1st floor of the [facility] on the day of surgery. To find out your arrival time, please call [phone] between 1PM - 3PM on: 10/01/2021  REMEMBER: Instructions that are not followed completely may result in serious medical risk, up to and including death; or upon the discretion of your surgeon and anesthesiologist your surgery may need to be rescheduled.  Do not eat food after midnight the night before surgery.  No gum chewing, lozengers or hard candies.  You may however, drink water up to 2 hours before you are scheduled to arrive for your surgery. Do not drink anything within 2 hours of your scheduled arrival time.  TAKE THESE MEDICATIONS THE MORNING OF SURGERY WITH A SIP OF WATER: - PARoxetine (PAXIL) 20 MG tablet - oxybutynin (DITROPAN-XL) 10 MG 24 hr tablet - tamsulosin (FLOMAX) 0.4 MG CAPS capsule  One week prior to surgery: Stop Anti-inflammatories (NSAIDS) such as Advil, Aleve, Ibuprofen, Motrin, Naproxen, Naprosyn and Aspirin based products such as Excedrin, Goodys Powder, BC Powder. You may however, continue to take Tylenol if needed for pain up until the day of surgery.  Stop any over the counter vitamins and supplements until after surgery.   No Alcohol for 24 hours before or after surgery.  No Smoking including e-cigarettes for 24 hours prior to surgery.  No chewable tobacco products for at least 6 hours prior to surgery.  No nicotine patches on the day of surgery.  Do not use any "recreational" drugs for at least a week prior to your surgery.  Please be advised that the combination of cocaine and anesthesia may have negative outcomes, up to and including death. If you test positive for cocaine, your surgery will be cancelled.  On the morning of surgery brush your teeth with toothpaste and water, you may rinse your mouth with mouthwash if you wish. Do not swallow any toothpaste or  mouthwash.  Do not wear jewelry, make-up, hairpins, clips or nail polish.  Do not wear lotions, powders, or perfumes.   Do not shave body from the neck down 48 hours prior to surgery just in case you cut yourself which could leave a site for infection.   Do not bring valuables to the hospital. [facility] is not responsible for any missing/lost belongings or valuables.   Notify your doctor if there is any change in your medical condition (cold, fever, infection).  Wear comfortable clothing (specific to your surgery type) to the hospital.  If you are being admitted to the hospital overnight, leave your suitcase in the car. After surgery it may be brought to your room.  If you are being discharged the day of surgery, you will not be allowed to drive home. You will need a responsible adult (18 years or older) to drive you home and stay with you that night.   If you are taking public transportation, you will need to have a responsible adult (18 years or older) with you. Please confirm with your physician that it is acceptable to use public transportation.   Please call the [place] Dept. at [phone] if you have any questions about these instructions.  Surgery Visitation Policy:  Patients undergoing a surgery or procedure may have one family member or support person in the surgical waiting area with them as long as that person is not COVID-19 positive or experiencing its symptoms.  The patient is not allowed to have any visitors  in the pre-operative area. Visitors must remain in the surgical waiting room.   Inpatient Visitation:    Visiting hours are 7 a.m. to 8 p.m. Up to two visitors ages 16+ are allowed at one time in a patient room. The visitors may rotate out with other people during the day. Visitors must check out when they leave, or other visitors will not be allowed. One designated support person may remain overnight. The visitor must pass COVID-19  screenings, use hand sanitizer when entering and exiting the patients room and wear a mask at all times, including in the patients room. Patients must also wear a mask when staff or their visitor are in the room. Masking is required regardless of vaccination status.

## 2021-10-02 ENCOUNTER — Encounter: Payer: Self-pay | Admitting: Urology

## 2021-10-02 ENCOUNTER — Ambulatory Visit: Payer: 59

## 2021-10-02 ENCOUNTER — Other Ambulatory Visit: Payer: Self-pay

## 2021-10-02 ENCOUNTER — Ambulatory Visit
Admission: RE | Admit: 2021-10-02 | Discharge: 2021-10-02 | Disposition: A | Discharge status: Home / Self Care | Payer: 59 | Attending: Urology | Admitting: Urology

## 2021-10-02 ENCOUNTER — Encounter
Admission: RE | Disposition: A | Discharge status: Home / Self Care | Payer: Self-pay | Source: Home / Self Care | Attending: Urology

## 2021-10-02 DIAGNOSIS — N201 Calculus of ureter: Secondary | ICD-10-CM | POA: Insufficient documentation

## 2021-10-02 DIAGNOSIS — Z87442 Personal history of urinary calculi: Secondary | ICD-10-CM | POA: Insufficient documentation

## 2021-10-02 DIAGNOSIS — Z6833 Body mass index (BMI) 33.0-33.9, adult: Secondary | ICD-10-CM | POA: Insufficient documentation

## 2021-10-02 DIAGNOSIS — Z87440 Personal history of urinary (tract) infections: Secondary | ICD-10-CM | POA: Insufficient documentation

## 2021-10-02 DIAGNOSIS — E669 Obesity, unspecified: Secondary | ICD-10-CM | POA: Diagnosis not present

## 2021-10-02 DIAGNOSIS — F411 Generalized anxiety disorder: Secondary | ICD-10-CM | POA: Diagnosis not present

## 2021-10-02 HISTORY — PX: CYSTOSCOPY/URETEROSCOPY/HOLMIUM LASER/STENT PLACEMENT: SHX6546

## 2021-10-02 LAB — POCT PREGNANCY, URINE: Preg Test, Ur: NEGATIVE

## 2021-10-02 SURGERY — CYSTOSCOPY/URETEROSCOPY/HOLMIUM LASER/STENT PLACEMENT
Anesthesia: General | Laterality: Right

## 2021-10-02 MED ORDER — ACETAMINOPHEN 10 MG/ML IV SOLN: 1000.0000 mg | Freq: Once | INTRAVENOUS | Status: DC | PRN

## 2021-10-02 MED ORDER — DEXAMETHASONE SODIUM PHOSPHATE 10 MG/ML IJ SOLN
INTRAMUSCULAR | Status: DC | PRN
  Administered 2021-10-02: 8 mg via INTRAVENOUS

## 2021-10-02 MED ORDER — LIDOCAINE HCL (CARDIAC) PF 100 MG/5ML IV SOSY
PREFILLED_SYRINGE | INTRAVENOUS | Status: DC | PRN
  Administered 2021-10-02: 80 mg via INTRAVENOUS

## 2021-10-02 MED ORDER — MIDAZOLAM HCL 2 MG/2ML IJ SOLN
INTRAMUSCULAR | Status: AC
  Filled 2021-10-02: qty 2

## 2021-10-02 MED ORDER — LACTATED RINGERS IV SOLN: INTRAVENOUS | Status: DC

## 2021-10-02 MED ORDER — SUGAMMADEX SODIUM 200 MG/2ML IV SOLN
INTRAVENOUS | Status: DC | PRN
Start: 2021-10-02 — End: 2021-10-02
  Administered 2021-10-02: 100 mg via INTRAVENOUS
  Administered 2021-10-02: 200 mg via INTRAVENOUS

## 2021-10-02 MED ORDER — PROPOFOL 10 MG/ML IV BOLUS
INTRAVENOUS | Status: DC | PRN
  Administered 2021-10-02: 150 mg via INTRAVENOUS

## 2021-10-02 MED ORDER — CHLORHEXIDINE GLUCONATE 0.12 % MT SOLN
OROMUCOSAL | Status: AC
  Filled 2021-10-02: qty 15

## 2021-10-02 MED ORDER — KETOROLAC TROMETHAMINE 30 MG/ML IJ SOLN
INTRAMUSCULAR | Status: AC
  Filled 2021-10-02: qty 1

## 2021-10-02 MED ORDER — OXYCODONE HCL 5 MG/5ML PO SOLN: 5.0000 mg | Freq: Once | ORAL | Status: DC | PRN

## 2021-10-02 MED ORDER — CIPROFLOXACIN IN D5W 400 MG/200ML IV SOLN
INTRAVENOUS | Status: AC
  Filled 2021-10-02: qty 200

## 2021-10-02 MED ORDER — DEXMEDETOMIDINE (PRECEDEX) IN NS 20 MCG/5ML (4 MCG/ML) IV SYRINGE
PREFILLED_SYRINGE | INTRAVENOUS | Status: AC
  Filled 2021-10-02: qty 10

## 2021-10-02 MED ORDER — CHLORHEXIDINE GLUCONATE 0.12 % MT SOLN
15.0000 mL | Freq: Once | OROMUCOSAL | Status: AC
  Administered 2021-10-02: 15 mL via OROMUCOSAL

## 2021-10-02 MED ORDER — ORAL CARE MOUTH RINSE: 15.0000 mL | Freq: Once | OROMUCOSAL | Status: AC

## 2021-10-02 MED ORDER — IOHEXOL 180 MG/ML  SOLN
INTRAMUSCULAR | Status: DC | PRN
  Administered 2021-10-02: 10 mL

## 2021-10-02 MED ORDER — CIPROFLOXACIN IN D5W 400 MG/200ML IV SOLN
400.0000 mg | INTRAVENOUS | Status: AC
  Administered 2021-10-02: 400 mg via INTRAVENOUS

## 2021-10-02 MED ORDER — KETOROLAC TROMETHAMINE 30 MG/ML IJ SOLN
INTRAMUSCULAR | Status: DC | PRN
  Administered 2021-10-02: 15 mg via INTRAVENOUS

## 2021-10-02 MED ORDER — SODIUM CHLORIDE 0.9 % IR SOLN
Status: DC | PRN
  Administered 2021-10-02: 1500 mL via INTRAVESICAL

## 2021-10-02 MED ORDER — ROCURONIUM BROMIDE 100 MG/10ML IV SOLN
INTRAVENOUS | Status: DC | PRN
  Administered 2021-10-02: 50 mg via INTRAVENOUS

## 2021-10-02 MED ORDER — PROPOFOL 500 MG/50ML IV EMUL
INTRAVENOUS | Status: AC
Start: 2021-10-02 — End: ?
  Filled 2021-10-02: qty 50

## 2021-10-02 MED ORDER — FENTANYL CITRATE (PF) 100 MCG/2ML IJ SOLN
INTRAMUSCULAR | Status: AC
  Filled 2021-10-02: qty 2

## 2021-10-02 MED ORDER — OXYCODONE HCL 5 MG PO TABS: 5.0000 mg | ORAL_TABLET | Freq: Once | ORAL | Status: DC | PRN

## 2021-10-02 MED ORDER — DEXMEDETOMIDINE (PRECEDEX) IN NS 20 MCG/5ML (4 MCG/ML) IV SYRINGE
PREFILLED_SYRINGE | INTRAVENOUS | Status: DC | PRN
  Administered 2021-10-02: 6 ug via INTRAVENOUS
  Administered 2021-10-02 (×2): 4 ug via INTRAVENOUS
  Administered 2021-10-02 (×2): 6 ug via INTRAVENOUS

## 2021-10-02 MED ORDER — PHENYLEPHRINE HCL (PRESSORS) 10 MG/ML IV SOLN
INTRAVENOUS | Status: DC | PRN
  Administered 2021-10-02: 120 ug via INTRAVENOUS

## 2021-10-02 MED ORDER — FENTANYL CITRATE (PF) 100 MCG/2ML IJ SOLN: 25.0000 ug | INTRAMUSCULAR | Status: DC | PRN

## 2021-10-02 MED ORDER — FENTANYL CITRATE (PF) 100 MCG/2ML IJ SOLN
INTRAMUSCULAR | Status: DC | PRN
  Administered 2021-10-02 (×2): 50 ug via INTRAVENOUS

## 2021-10-02 MED ORDER — ONDANSETRON HCL 4 MG/2ML IJ SOLN
INTRAMUSCULAR | Status: DC | PRN
  Administered 2021-10-02: 4 mg via INTRAVENOUS

## 2021-10-02 MED ORDER — MIDAZOLAM HCL 2 MG/2ML IJ SOLN
INTRAMUSCULAR | Status: DC | PRN
  Administered 2021-10-02: 2 mg via INTRAVENOUS

## 2021-10-02 MED ORDER — ONDANSETRON HCL 4 MG/2ML IJ SOLN: 4.0000 mg | Freq: Once | INTRAMUSCULAR | Status: DC | PRN

## 2021-10-02 SURGICAL SUPPLY — 33 items
ADH LQ OCL WTPRF AMP STRL LF (MISCELLANEOUS)
ADHESIVE MASTISOL STRL (MISCELLANEOUS) IMPLANT
BAG DRAIN CYSTO-URO LG1000N (MISCELLANEOUS) ×2 IMPLANT
BRUSH SCRUB EZ 1% IODOPHOR (MISCELLANEOUS) ×2 IMPLANT
CATH URET FLEX-TIP 2 LUMEN 10F (CATHETERS) IMPLANT
CATH URETL OPEN 5X70 (CATHETERS) IMPLANT
CNTNR SPEC 2.5X3XGRAD LEK (MISCELLANEOUS)
CONT SPEC 4OZ STER OR WHT (MISCELLANEOUS)
CONT SPEC 4OZ STRL OR WHT (MISCELLANEOUS)
CONTAINER SPEC 2.5X3XGRAD LEK (MISCELLANEOUS) IMPLANT
DRAPE UTILITY 15X26 TOWEL STRL (DRAPES) ×2 IMPLANT
DRSG TEGADERM 2-3/8X2-3/4 SM (GAUZE/BANDAGES/DRESSINGS) IMPLANT
GAUZE 4X4 16PLY ~~LOC~~+RFID DBL (SPONGE) ×4 IMPLANT
GLOVE SURG UNDER POLY LF SZ7.5 (GLOVE) ×2 IMPLANT
GOWN STRL REUS W/ TWL LRG LVL3 (GOWN DISPOSABLE) ×1 IMPLANT
GOWN STRL REUS W/ TWL XL LVL3 (GOWN DISPOSABLE) ×1 IMPLANT
GOWN STRL REUS W/TWL LRG LVL3 (GOWN DISPOSABLE) ×2
GOWN STRL REUS W/TWL XL LVL3 (GOWN DISPOSABLE) ×2
GUIDEWIRE STR DUAL SENSOR (WIRE) ×2 IMPLANT
INFUSOR MANOMETER BAG 3000ML (MISCELLANEOUS) ×2 IMPLANT
IV NS IRRIG 3000ML ARTHROMATIC (IV SOLUTION) ×2 IMPLANT
KIT TURNOVER CYSTO (KITS) ×2 IMPLANT
PACK CYSTO AR (MISCELLANEOUS) ×2 IMPLANT
SET CYSTO W/LG BORE CLAMP LF (SET/KITS/TRAYS/PACK) ×2 IMPLANT
SHEATH URETERAL 12FRX35CM (MISCELLANEOUS) IMPLANT
STENT URET 6FRX24 CONTOUR (STENTS) IMPLANT
STENT URET 6FRX26 CONTOUR (STENTS) IMPLANT
SURGILUBE 2OZ TUBE FLIPTOP (MISCELLANEOUS) ×2 IMPLANT
SYR 10ML LL (SYRINGE) ×2 IMPLANT
TRACTIP FLEXIVA PULSE ID 200 (Laser) IMPLANT
VALVE UROSEAL ADJ ENDO (VALVE) IMPLANT
WATER STERILE IRR 1000ML POUR (IV SOLUTION) ×2 IMPLANT
WATER STERILE IRR 500ML POUR (IV SOLUTION) ×2 IMPLANT

## 2021-10-02 NOTE — Anesthesia Preprocedure Evaluation (Addendum)
Anesthesia Evaluation  Patient identified by MRN, date of birth, ID band Patient awake    Reviewed: Allergy & Precautions, NPO status , Patient's Chart, lab work & pertinent test results  History of Anesthesia Complications Negative for: history of anesthetic complications  Airway Mallampati: III   Neck ROM: Full    Dental   Missing upper molars x3:   Pulmonary neg pulmonary ROS,    Pulmonary exam normal breath sounds clear to auscultation       Cardiovascular Exercise Tolerance: Good negative cardio ROS Normal cardiovascular exam Rhythm:Regular Rate:Normal  ECG 10/31/20: normal   Neuro/Psych PSYCHIATRIC DISORDERS Anxiety negative neurological ROS     GI/Hepatic negative GI ROS, Fatty liver   Endo/Other  Obesity  Renal/GU Renal disease (nephrolithiasis)     Musculoskeletal   Abdominal   Peds  Hematology   Anesthesia Other Findings   Reproductive/Obstetrics                            Anesthesia Physical Anesthesia Plan  ASA: 2  Anesthesia Plan: General   Post-op Pain Management:    Induction: Intravenous  PONV Risk Score and Plan: 3 and Ondansetron, Dexamethasone and Treatment may vary due to age or medical condition  Airway Management Planned: Oral ETT  Additional Equipment:   Intra-op Plan:   Post-operative Plan: Extubation in OR  Informed Consent: I have reviewed the patients History and Physical, chart, labs and discussed the procedure including the risks, benefits and alternatives for the proposed anesthesia with the patient or authorized representative who has indicated his/her understanding and acceptance.     Dental advisory given  Plan Discussed with: CRNA  Anesthesia Plan Comments: (Patient consented for risks of anesthesia including but not limited to:  - adverse reactions to medications - damage to eyes, teeth, lips or other oral mucosa - nerve damage due  to positioning  - sore throat or hoarseness - damage to heart, brain, nerves, lungs, other parts of body or loss of life  Informed patient about role of CRNA in peri- and intra-operative care.  Patient voiced understanding.)       Anesthesia Quick Evaluation

## 2021-10-02 NOTE — Op Note (Signed)
Date of procedure: 10/02/21  Preoperative diagnosis:  Right distal ureteral stone  Postoperative diagnosis:  Same  Procedure: Cystoscopy, right diagnostic ureteroscopy, right retrograde pyelogram with intraoperative interpretation, right ureteral stent removal  Surgeon: Nickolas Madrid, MD  Anesthesia: General  Complications: None  Intraoperative findings:  Normal cystoscopy No evidence of stones in the right ureter or up in the right kidney on flexible ureteroscopy, suspect had passed spontaneously alongside stent Excellent drainage from right kidney after retrograde, no stent replaced  EBL: None  Specimens: None  Drains: None  Indication: Janet Harmon is a 51 y.o. patient who previously presented with a 6 mm distal ureteral stone and sepsis from urinary source and underwent emergent stent placement, and presents today for definitive management.  After reviewing the management options for treatment, they elected to proceed with the above surgical procedure(s). We have discussed the potential benefits and risks of the procedure, side effects of the proposed treatment, the likelihood of the patient achieving the goals of the procedure, and any potential problems that might occur during the procedure or recuperation. Informed consent has been obtained.  Description of procedure:  The patient was taken to the operating room and general anesthesia was induced. SCDs were placed for DVT prophylaxis. The patient was placed in the dorsal lithotomy position, prepped and draped in the usual sterile fashion, and preoperative antibiotics(Cipro) were administered. A preoperative time-out was performed.   A 21 French rigid cystoscope was used to intubate the urethra and thorough cystoscopy was performed.  The bladder was grossly normal.  The right ureteral stent was grasped and pulled to the meatus, and a sensor wire advanced up to the kidney under fluoroscopic vision, and the old stent was  removed.  A semirigid short ureteroscope advanced easily alongside the wire and there was no evidence of any distal or mid ureteral stones.  At this point I passed a single channel digital flexible ureteroscope over the wire up to the kidney under fluoroscopic vision and thorough pyeloscopy revealed no evidence of stones in the right kidney.  A retrograde pyelogram was performed on the right side from the proximal ureter and showed no extravasation or filling defects.  Careful pullback ureteroscopy showed no stones or abnormalities.  The rigid cystoscope was reinserted and there was brisk efflux of urine from the right ureter and a stent was not replaced.  Disposition: Stable to PACU  Plan: Follow-up with urology as needed  Nickolas Madrid, MD

## 2021-10-02 NOTE — H&P (Signed)
° °  10/02/21 12:43 PM   Janet Harmon [birth year] [id number]  CC: Right ureteral stone  HPI: 51 year old female who previously presented with sepsis and UTI from a 8 mm right distal ureteral stone and underwent emergent stent placement at that time on 09/18/2021.  She presents today for definitive management.  Denies fevers chills, dysuria.    PMH: Past Medical History:  Diagnosis Date   GAD (generalized anxiety disorder)    Kidney stone    Prediabetes    Vitamin B 12 deficiency    Vitamin D deficiency     Surgical History: Past Surgical History:  Procedure Laterality Date   ABDOMINAL SURGERY     CESAREAN SECTION  2008   X2 - 2008, 2014   CYSTOSCOPY W/ RETROGRADES  09/18/2021   Procedure: CYSTOSCOPY WITH RETROGRADE PYELOGRAM;  Surgeon: Billey Co, MD;  Location: [facility] ORS;  Service: Urology;;   CYSTOSCOPY WITH STENT PLACEMENT Right 09/18/2021   Procedure: CYSTOSCOPY WITH STENT PLACEMENT;  Surgeon: Billey Co, MD;  Location: [facility] ORS;  Service: Urology;  Laterality: Right;   EXTRACORPOREAL SHOCK WAVE LITHOTRIPSY Left 05/20/2016   Procedure: EXTRACORPOREAL SHOCK WAVE LITHOTRIPSY (ESWL);  Surgeon: Royston Cowper, MD;  Location: [facility] ORS;  Service: Urology;  Laterality: Left;   TUBAL LIGATION  2014   [place] TOOTH EXTRACTION        Family History: Family History  Problem Relation Age of Onset   Prostate cancer Father 66   Breast cancer Neg Hx     Social History:  reports that she has never smoked. She has never used smokeless tobacco. She reports current alcohol use. She reports that she does not use drugs.  Physical Exam: BP 120/84    Pulse 78    Temp 98 F (36.7 C) (Oral)    Resp 20    Ht 5\' 4"  (1.626 m)    Wt 83.9 kg    LMP 09/15/2021    SpO2 98%    BMI 31.76 kg/m    Constitutional:  Alert and oriented, No acute distress. Cardiovascular: Regular rate and rhythm Respiratory: Clear to auscultation bilaterally GI: Abdomen is soft, nontender,  nondistended, no abdominal masses  Laboratory Data: Urine culture 09/18/2021 with E. coli, treated with culture appropriate antibiotics  Assessment & Plan:   51 year old female previously presented with sepsis from urinary source and an 8 mm right distal ureteral stone and underwent emergent stent placement.  Presents today for definitive management with right ureteroscopy and laser lithotripsy.  We specifically discussed the risks ureteroscopy including bleeding, infection/sepsis, stent related symptoms including flank pain/urgency/frequency/incontinence/dysuria, ureteral injury, inability to access stone, or need for staged or additional procedures.  Right ureteroscopy, laser lithotripsy, stent placement  Nickolas Madrid, MD 10/02/2021  [facility] [address] [place], [place] [address] [phone]

## 2021-10-02 NOTE — Anesthesia Procedure Notes (Signed)
Procedure Name: Intubation Date/Time: 10/02/2021 1:03 PM Performed by: Bea Graff, RN Pre-anesthesia Checklist: Patient identified, Emergency Drugs available, Suction available and Patient being monitored Patient Re-evaluated:Patient Re-evaluated prior to induction Oxygen Delivery Method: Circle system utilized Preoxygenation: Pre-oxygenation with 100% oxygen Induction Type: IV induction Ventilation: Mask ventilation without difficulty Laryngoscope Size: McGraph and 3 Grade View: Grade I Tube type: Oral Tube size: 6.5 mm Number of attempts: 1 Airway Equipment and Method: Stylet and Oral airway Placement Confirmation: ETT inserted through vocal cords under direct vision, positive ETCO2 and breath sounds checked- equal and bilateral Secured at: 21 cm Tube secured with: Tape Dental Injury: Teeth and Oropharynx as per pre-operative assessment

## 2021-10-02 NOTE — Discharge Instructions (Signed)

## 2021-10-02 NOTE — Anesthesia Postprocedure Evaluation (Signed)
Anesthesia Post Note  Patient: Janet Harmon  Procedure(s) Performed: CYSTOSCOPY/URETEROSCOPY STENT REMOVAL (Right)  Patient location during evaluation: PACU Anesthesia Type: General Level of consciousness: awake and alert, oriented and patient cooperative Pain management: pain level controlled Vital Signs Assessment: post-procedure vital signs reviewed and stable Respiratory status: spontaneous breathing, nonlabored ventilation and respiratory function stable Cardiovascular status: blood pressure returned to baseline and stable Postop Assessment: adequate PO intake Anesthetic complications: no   No notable events documented.   Last Vitals:  Vitals:   10/02/21 1345 10/02/21 1400  BP: 102/65 102/64  Pulse: 70 68  Resp: 20 20  Temp:  (!) 36.4 C  SpO2: 95% 94%    Last Pain:  Vitals:   10/02/21 1400  TempSrc:   PainSc: 0-No pain                 Darrin Nipper

## 2021-10-02 NOTE — Transfer of Care (Signed)
Immediate Anesthesia Transfer of Care Note  Patient: Janet Harmon  Procedure(s) Performed: CYSTOSCOPY/URETEROSCOPY STENT REMOVAL (Right)  Patient Location: PACU  Anesthesia Type:General  Level of Consciousness: awake  Airway & Oxygen Therapy: Patient Spontanous Breathing and Patient connected to face mask oxygen  Post-op Assessment: Report given to RN and Post -op Vital signs reviewed and stable  Post vital signs: Reviewed and stable  Last Vitals:  Vitals Value Taken Time  BP 109/69 10/02/21 1333  Temp 36.4 C 10/02/21 1333  Pulse 66 10/02/21 1335  Resp 20 10/02/21 1335  SpO2 98 % 10/02/21 1335  Vitals shown include unvalidated device data.  Last Pain:  Vitals:   10/02/21 1128  TempSrc: Oral         Complications: No notable events documented.

## 2021-10-03 ENCOUNTER — Encounter: Payer: Self-pay | Admitting: Urology

## 2023-12-07 ENCOUNTER — Emergency Department
Admission: EM | Admit: 2023-12-07 | Discharge: 2023-12-07 | Disposition: A | Discharge status: Home / Self Care | Payer: Self-pay | Attending: Emergency Medicine | Admitting: Emergency Medicine

## 2023-12-07 ENCOUNTER — Other Ambulatory Visit: Payer: Self-pay

## 2023-12-07 DIAGNOSIS — F419 Anxiety disorder, unspecified: Secondary | ICD-10-CM | POA: Insufficient documentation

## 2023-12-07 LAB — COMPREHENSIVE METABOLIC PANEL WITH GFR
ALT: 28 U/L (ref 0–44)
AST: 22 U/L (ref 15–41)
Albumin: 4.2 g/dL (ref 3.5–5.0)
Alkaline Phosphatase: 88 U/L (ref 38–126)
Anion gap: 10 (ref 5–15)
BUN: 10 mg/dL (ref 6–20)
CO2: 23 mmol/L (ref 22–32)
Calcium: 9.1 mg/dL (ref 8.9–10.3)
Chloride: 105 mmol/L (ref 98–111)
Creatinine, Ser: 0.81 mg/dL (ref 0.44–1.00)
GFR, Estimated: >60 mL/min (ref >60)
Glucose, Bld: 111 mg/dL — ABNORMAL HIGH (ref 70–99)
Potassium: 3.7 mmol/L (ref 3.5–5.1)
Sodium: 138 mmol/L (ref 135–145)
Total Bilirubin: 1.3 mg/dL — ABNORMAL HIGH (ref 0.0–1.2)
Total Protein: 7.6 g/dL (ref 6.5–8.1)

## 2023-12-07 LAB — CBC WITH DIFFERENTIAL/PLATELET
Abs Immature Granulocytes: 0.02 10*3/uL (ref 0.00–0.07)
Basophils Absolute: 0.1 10*3/uL (ref 0.0–0.1)
Basophils Relative: 1 %
Eosinophils Absolute: 0.1 10*3/uL (ref 0.0–0.5)
Eosinophils Relative: 1 %
HCT: 43.1 % (ref 36.0–46.0)
Hemoglobin: 14.2 g/dL (ref 12.0–15.0)
Immature Granulocytes: 0 %
Lymphocytes Relative: 34 %
Lymphs Abs: 2.8 10*3/uL (ref 0.7–4.0)
MCH: 30.5 pg (ref 26.0–34.0)
MCHC: 32.9 g/dL (ref 30.0–36.0)
MCV: 92.7 fL (ref 80.0–100.0)
Monocytes Absolute: 0.7 10*3/uL (ref 0.1–1.0)
Monocytes Relative: 9 %
Neutro Abs: 4.6 10*3/uL (ref 1.7–7.7)
Neutrophils Relative %: 55 %
Platelets: 272 10*3/uL (ref 150–400)
RBC: 4.65 MIL/uL (ref 3.87–5.11)
RDW: 13.1 % (ref 11.5–15.5)
WBC: 8.3 10*3/uL (ref 4.0–10.5)
nRBC: 0 % (ref 0.0–0.2)

## 2023-12-07 LAB — TROPONIN I (HIGH SENSITIVITY): Troponin I (High Sensitivity): <2 ng/L (ref <18)

## 2023-12-07 MED ORDER — HYDROXYZINE PAMOATE 100 MG PO CAPS: 100.0000 mg | ORAL_CAPSULE | Freq: Three times a day (TID) | ORAL | 0 refills | Status: AC | PRN

## 2023-12-07 NOTE — ED Triage Notes (Signed)
 Pt said she broke out into a sweat at home while sitting on the couch. This prompted her to take her BP and it was 170s, 150s, and then 140s. Pt just started on paroxetine  3 days ago. Pt denies any pain, but endorses increased anxiety.

## 2023-12-07 NOTE — ED Notes (Signed)
 Pt is CAOx4, breathing normally, and normal in color. Pt is sitting upright in the bed and appears to be slightly anxious. Pt advised she does have a hx of anxiety and feels anxious right now because she has never had high blood pressure and she's unsure why it's high now. Pt advised she recently started a new anxiety medication and fears that may be the cause of her hypertension.

## 2023-12-07 NOTE — ED Provider Notes (Signed)
 [facility] Provider Note    Event Date/Time   First MD Initiated Contact with Patient 12/07/23 1938     (approximate)   History   Hypertension   HPI  Janet Harmon is a 53 y.o. female with PMH of prediabetes and GAD who presents for evaluation of high blood pressure.  Patient states that earlier this afternoon she had an episode where she began sweating profusely and this prompted her to take her blood pressure which was elevated in the 170s. Patient reports she has never had problems with her BP before and has not been on any medication for this.  Patient states she was seen by her primary care provider on Friday due to concerns about pain with defecation.  Her PCP determined she had hemorrhoids and recommended Preparation H cream and follow-up with GI.  Patient states that she has had a lot of anxiety surrounding this rectal pain.  At that visit with her primary care provider she asked to be placed back on paroxetine  which she has taken previously for anxiety.  Patient denies chest pain, shortness of breath, difficulty breathing and changes in her vision.  Patient is unsure if the sweating episode occurred today because of elevated blood pressure or if it is a side effect from her starting the medication.      Physical Exam   Triage Vital Signs: ED Triage Vitals  Encounter Vitals Group     BP 12/07/23 1840 (!) 155/87     Systolic BP Percentile --      Diastolic BP Percentile --      Pulse Rate 12/07/23 1840 80     Resp 12/07/23 1840 20     Temp 12/07/23 1840 98.2 F (36.8 C)     Temp Source 12/07/23 1840 Oral     SpO2 12/07/23 1840 94 %     Weight 12/07/23 1841 200 lb (90.7 kg)     Height 12/07/23 1841 5\' 4"  (1.626 m)     Head Circumference --      Peak Flow --      Pain Score 12/07/23 1841 0     Pain Loc --      Pain Education --      Exclude from Growth Chart --     Most recent vital signs: Vitals:   12/07/23 1957 12/07/23 2037  BP: (!)  153/82 128/83  Pulse: 76   Resp: 20   Temp:    SpO2: 99%    General: Awake, no distress.  CV:  Good peripheral perfusion. RRR. Resp:  Normal effort. CTAB. Abd:  No distention.  Other:     ED Results / Procedures / Treatments   Labs (all labs ordered are listed, but only abnormal results are displayed) Labs Reviewed  COMPREHENSIVE METABOLIC PANEL WITH GFR - Abnormal; Notable for the following components:      Result Value   Glucose, Bld 111 (*)    Total Bilirubin 1.3 (*)    All other components within normal limits  CBC WITH DIFFERENTIAL/PLATELET  TROPONIN I (HIGH SENSITIVITY)     PROCEDURES:  Critical Care performed: No  Procedures   MEDICATIONS ORDERED IN ED: Medications - No data to display   IMPRESSION / MDM / ASSESSMENT AND PLAN / ED COURSE  I reviewed the triage vital signs and the nursing notes.  53 year old female presents for evaluation of high blood pressure.  Blood pressure was elevated upon presentation but has returned to normal at the time of my evaluation.  Vital signs are otherwise stable.  Patient NAD on exam.  Differential diagnosis includes, but is not limited to, hypertensive urgency, hypertensive emergency, panic attack, anxiety, uncontrolled hypertension.  Patient's presentation is most consistent with acute complicated illness / injury requiring diagnostic workup.  CMP, CBC and troponin all unremarkable.  Physical exam is reassuring.  No concern for hypertensive urgency or hypertensive emergency at this time.  Patient's blood pressure returned to her baseline after our conversation so I believe her symptoms are related to anxiety.  It sounds like what occurred this afternoon was a panic attack and patient's blood pressure was elevated as a result of this.  I spent a long time discussing patient's symptoms with her.  She expressed concerns of having colon cancer due to her presence of hemorrhoids.  She was given  reassurance regarding this.  We discussed over-the-counter treatments for constipation including stool softeners, supplements and dietary changes.  I will give her a prescription for hydroxyzine so she can take this as needed for panic attacks while she waits for the anxiety medication to begin to take effect.  We did discuss doing a slow taper with her anxiety medication.  I advised her to take half a pill for a week and then increase to the full pill after that.  Patient voiced understanding, all questions were answered and she was stable at discharge.      FINAL CLINICAL IMPRESSION(S) / ED DIAGNOSES   Final diagnoses:  Anxiety     Rx / DC Orders   ED Discharge Orders          Ordered    hydrOXYzine (VISTARIL) 100 MG capsule  3 times daily PRN        12/07/23 2042             Note:  This document was prepared using Dragon voice recognition software and may include unintentional dictation errors.   Phyliss Breen, PA-C 12/07/23 2045    Shane Darling, MD 12/07/23 2115

## 2023-12-07 NOTE — Discharge Instructions (Addendum)
 Your blood work was normal today.  Please take 10 mg of the paroxetine  for a week.  After this you can take the full dosage of 20 mg.  I have also sent a medication called hydroxyzine.  This can be taken as needed for anxiety symptoms.  In regards to the constipation, make sure you are drinking at least 60 ounces of water per day.  Regular physical activity will also help.  This can be as simple as walking for 15 minutes/day.  Try to increase your fiber intake.  Fiber comes from fruits and vegetables.  You can also sprinkle Chia seeds or flaxseeds on food as this will provide a good source of fiber.  I recommend taking a stool softener like Colace or MiraLAX .  Please take these according to the package instructions.  You can also take 250 mg of magnesium citrate daily.  This will help with constipation and anxiety.  I have attached information for general surgery.  You can schedule an appointment with them to discuss possible surgical removal of the hemorrhoids if you would like.  Return to the emergency department with any worsening symptoms.
